# Patient Record
Sex: Female | Born: 1995 | Race: Black or African American | Hispanic: No | Marital: Single | Smoking: Never smoker
Health system: Southern US, Community
[De-identification: ages and names within clinical notes are randomized; demographics above are authoritative.]

## PROBLEM LIST (undated history)

## (undated) ENCOUNTER — Emergency Department (HOSPITAL_COMMUNITY): Discharge status: Absent without leave | Payer: Self-pay

---
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
## 2014-01-26 ENCOUNTER — Encounter (HOSPITAL_COMMUNITY): Payer: Self-pay | Admitting: Emergency Medicine

## 2014-01-26 ENCOUNTER — Emergency Department (HOSPITAL_COMMUNITY)
Admission: EM | Admit: 2014-01-26 | Discharge: 2014-01-26 | Disposition: A | Discharge status: Home / Self Care | Payer: BC Managed Care – PPO | Source: Home / Self Care

## 2014-01-26 DIAGNOSIS — R0982 Postnasal drip: Secondary | ICD-10-CM

## 2014-01-26 LAB — POCT RAPID STREP A: Streptococcus, Group A Screen (Direct): NEGATIVE

## 2014-01-26 NOTE — ED Notes (Signed)
Uri symptoms.  Onset of symptoms 3/2 and c/o hot and cold flashes, Friday (3/6) started the day with laryngitis and reports this am started the same.  Minimal cough and sniffles.  Patient's voice sounds strong

## 2014-01-26 NOTE — Discharge Instructions (Signed)
Upper Respiratory Infection, Adult Post nasal drip Allegra 180 mg a day Nasal saline frequently Plenty of fluids An upper respiratory infection (URI) is also sometimes known as the common cold. The upper respiratory tract includes the nose, sinuses, throat, trachea, and bronchi. Bronchi are the airways leading to the lungs. Most people improve within 1 week, but symptoms can last up to 2 weeks. A residual cough may last even longer.  CAUSES Many different viruses can infect the tissues lining the upper respiratory tract. The tissues become irritated and inflamed and often become very moist. Mucus production is also common. A cold is contagious. You can easily spread the virus to others by oral contact. This includes kissing, sharing a glass, coughing, or sneezing. Touching your mouth or nose and then touching a surface, which is then touched by another person, can also spread the virus. SYMPTOMS  Symptoms typically develop 1 to 3 days after you come in contact with a cold virus. Symptoms vary from person to person. They may include:  Runny nose.  Sneezing.  Nasal congestion.  Sinus irritation.  Sore throat.  Loss of voice (laryngitis).  Cough.  Fatigue.  Muscle aches.  Loss of appetite.  Headache.  Low-grade fever. DIAGNOSIS  You might diagnose your own cold based on familiar symptoms, since most people get a cold 2 to 3 times a year. Your caregiver can confirm this based on your exam. Most importantly, your caregiver can check that your symptoms are not due to another disease such as strep throat, sinusitis, pneumonia, asthma, or epiglottitis. Blood tests, throat tests, and X-rays are not necessary to diagnose a common cold, but they may sometimes be helpful in excluding other more serious diseases. Your caregiver will decide if any further tests are required. RISKS AND COMPLICATIONS  You may be at risk for a more severe case of the common cold if you smoke cigarettes, have  chronic heart disease (such as heart failure) or lung disease (such as asthma), or if you have a weakened immune system. The very young and very old are also at risk for more serious infections. Bacterial sinusitis, middle ear infections, and bacterial pneumonia can complicate the common cold. The common cold can worsen asthma and chronic obstructive pulmonary disease (COPD). Sometimes, these complications can require emergency medical care and may be life-threatening. PREVENTION  The best way to protect against getting a cold is to practice good hygiene. Avoid oral or hand contact with people with cold symptoms. Wash your hands often if contact occurs. There is no clear evidence that vitamin C, vitamin E, echinacea, or exercise reduces the chance of developing a cold. However, it is always recommended to get plenty of rest and practice good nutrition. TREATMENT  Treatment is directed at relieving symptoms. There is no cure. Antibiotics are not effective, because the infection is caused by a virus, not by bacteria. Treatment may include:  Increased fluid intake. Sports drinks offer valuable electrolytes, sugars, and fluids.  Breathing heated mist or steam (vaporizer or shower).  Eating chicken soup or other clear broths, and maintaining good nutrition.  Getting plenty of rest.  Using gargles or lozenges for comfort.  Controlling fevers with ibuprofen or acetaminophen as directed by your caregiver.  Increasing usage of your inhaler if you have asthma. Zinc gel and zinc lozenges, taken in the first 24 hours of the common cold, can shorten the duration and lessen the severity of symptoms. Pain medicines may help with fever, muscle aches, and throat pain. A  variety of non-prescription medicines are available to treat congestion and runny nose. Your caregiver can make recommendations and may suggest nasal or lung inhalers for other symptoms.  HOME CARE INSTRUCTIONS   Only take over-the-counter or  prescription medicines for pain, discomfort, or fever as directed by your caregiver.  Use a warm mist humidifier or inhale steam from a shower to increase air moisture. This may keep secretions moist and make it easier to breathe.  Drink enough water and fluids to keep your urine clear or pale yellow.  Rest as needed.  Return to work when your temperature has returned to normal or as your caregiver advises. You may need to stay home longer to avoid infecting others. You can also use a face mask and careful hand washing to prevent spread of the virus. SEEK MEDICAL CARE IF:   After the first few days, you feel you are getting worse rather than better.  You need your caregiver's advice about medicines to control symptoms.  You develop chills, worsening shortness of breath, or brown or red sputum. These may be signs of pneumonia.  You develop yellow or brown nasal discharge or pain in the face, especially when you bend forward. These may be signs of sinusitis.  You develop a fever, swollen neck glands, pain with swallowing, or white areas in the back of your throat. These may be signs of strep throat. SEEK IMMEDIATE MEDICAL CARE IF:   You have a fever.  You develop severe or persistent headache, ear pain, sinus pain, or chest pain.  You develop wheezing, a prolonged cough, cough up blood, or have a change in your usual mucus (if you have chronic lung disease).  You develop sore muscles or a stiff neck. Document Released: 05/04/2001 Document Revised: 01/31/2012 Document Reviewed: 03/12/2011 [facility] Patient Information 2014 [place], [place].

## 2014-01-26 NOTE — ED Provider Notes (Signed)
CSN: [id number]     Arrival date & time 01/26/14  [id number] History   First MD Initiated Contact with Patient 01/26/14 1036     Chief Complaint  Patient presents with  . URI   (Consider location/radiation/quality/duration/timing/severity/associated sxs/prior Treatment) HPI Comments: C/O PND and change in voice for 3 d.   History reviewed. No pertinent past medical history. History reviewed. No pertinent past surgical history. No family history on file. History  Substance Use Topics  . Smoking status: Never Smoker   . Smokeless tobacco: Not on file  . Alcohol Use: No   OB History   Grav Para Term Preterm Abortions TAB SAB Ect Mult Living                 Review of Systems  Constitutional: Negative for fever, chills, activity change, appetite change and fatigue.  HENT: Positive for postnasal drip and rhinorrhea. Negative for congestion, facial swelling and sore throat.   Eyes: Negative.   Respiratory: Negative.   Cardiovascular: Negative.   Gastrointestinal: Negative.   Musculoskeletal: Negative for neck pain and neck stiffness.  Skin: Negative for pallor and rash.  Neurological: Negative.     Allergies  Penicillins  Home Medications   Current Outpatient Rx  Name  Route  Sig  Dispense  Refill  . ibuprofen (ADVIL,MOTRIN) 200 MG tablet   Oral   Take 200 mg by mouth every 6 (six) hours as needed.          BP 117/74  Pulse 97  Temp(Src) 97.5 F (36.4 C) (Oral)  Resp 19  SpO2 98%  LMP 01/26/2014 Physical Exam  Constitutional: She is oriented to person, place, and time. She appears well-developed and well-nourished. No distress.  HENT:  TM's nl OP with minor erythema and curtains of PND  Neck: Normal range of motion. Neck supple.  Cardiovascular: Normal rate and regular rhythm.   Pulmonary/Chest: Effort normal and breath sounds normal. No respiratory distress. She has no wheezes.  Musculoskeletal: Normal range of motion. She exhibits no edema.  Lymphadenopathy:   She has no cervical adenopathy.  Neurological: She is alert and oriented to person, place, and time.  Skin: Skin is warm and dry. No rash noted.  Psychiatric: She has a normal mood and affect.    ED Course  Procedures (including critical care time) Labs Review Labs Reviewed  POCT RAPID STREP A ([facility]ARE ONLY)   Imaging Review No results found. Results for orders placed during the hospital encounter of 01/26/14  POCT RAPID STREP A ([facility] ONLY)      Result Value Ref Range   Streptococcus, Group A Screen (Direct) NEGATIVE  NEGATIVE     MDM   1. PND (post-nasal drip)     PND instructions Allegra 180 mg Nasal saline Lots of fluids    Hayden Rasmussenavid Kadesha Virrueta, NP 01/26/14 1111

## 2014-01-26 NOTE — ED Provider Notes (Signed)
Medical screening examination/treatment/procedure(s) were performed by resident physician or non-physician practitioner and as supervising physician I was immediately available for consultation/collaboration.   Turki Tapanes DOUGLAS MD.   Fernandez Kenley D Gearlene Godsil, MD 01/26/14 1454 

## 2014-01-28 LAB — CULTURE, GROUP A STREP

## 2014-04-03 ENCOUNTER — Emergency Department (HOSPITAL_COMMUNITY)
Admission: EM | Admit: 2014-04-03 | Discharge: 2014-04-03 | Disposition: A | Discharge status: Home / Self Care | Payer: BC Managed Care – PPO | Source: Home / Self Care | Attending: Emergency Medicine | Admitting: Emergency Medicine

## 2014-04-03 ENCOUNTER — Encounter (HOSPITAL_COMMUNITY): Payer: Self-pay | Admitting: Emergency Medicine

## 2014-04-03 DIAGNOSIS — W2203XA Walked into furniture, initial encounter: Secondary | ICD-10-CM

## 2014-04-03 DIAGNOSIS — S0083XA Contusion of other part of head, initial encounter: Secondary | ICD-10-CM

## 2014-04-03 DIAGNOSIS — S1093XA Contusion of unspecified part of neck, initial encounter: Secondary | ICD-10-CM

## 2014-04-03 DIAGNOSIS — S0003XA Contusion of scalp, initial encounter: Secondary | ICD-10-CM

## 2014-04-03 NOTE — ED Notes (Signed)
C/o head injury States her bed collapse on her head last week States she did not loss conscious Did have a chronic headache last night

## 2014-04-03 NOTE — ED Provider Notes (Signed)
  Chief Complaint   Chief Complaint  Patient presents with  . 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 Apparently the bed swings up to get out of the way, and she was trying to let the bed back down again. It got away from her, striking her on the head. There was no loss of consciousness. She has a sore spot on the top of her head since then. There was no bleeding. She denies any symptoms of concussion such as persistent headache, light or sound sensitivity, or fuzzy thinking or difficulty concentrating. She's had no visual symptoms, no bleeding from the ears or the nose. She denies any numbness, tingling, weakness, or difficulty with speech or ambulation.  Review of Systems   Other than as noted above, the patient denies any of the following symptoms: Eye:  No eye pain, diplopia or blurred vision ENT:  No bleeding from nose or ears.  No loose or broken teeth. Neck:  No pain or limited ROM. GI:  No nausea or vomiting. Neuro:  No loss of consciousness, seizure activity, numbness, tingling, or weakness.  PMFSH   Past medical history, family history, social history, meds, and allergies were reviewed.   Physical Examination   Vital signs:  BP 105/63  Pulse 85  Temp(Src) 98.6 F (37 C) (Oral)  Resp 18  SpO2 100%  LMP 03/21/2014 General:  Alert and oriented times 3.  In no distress. Eye:  PERRL, full EOMs.  Lids and conjunctivas normal. Fundi benign. HEENT:  She has minimal soreness on the top of the head. There is no hematoma, swelling, or bruise, no palpable abnormality.  TMs and canals normal, nasal mucosa normal.  No oral lacerations.  Teeth were intact without obvious oral trauma. Neck:  Non tender.  Full ROM without pain. Neurological:  Alert and oriented.  Cranial nerves intact.  No pronator drift. Finger to nose test was normal.  No muscle weakness. DTRs were  symmetrical.  Sensation was intact to light touch. Gait was normal.  Romberg's sign negative.  Able to perform tandem gait well.  Assessment   The encounter diagnosis was Scalp contusion.  No evidence of concussion.  Plan   1.  Meds:  The following meds were prescribed:   Discharge Medication List as of 04/03/2014 12:18 PM      2.  Patient Education/Counseling:  The patient was given appropriate handouts, self care instructions, and instructed in symptomatic relief.    3.  Follow up:  The patient was told to follow up here if no better in 3 to 4 days,or sooner if becoming worse in any way, and given some red flag symptoms such as change in level of consciousness, worsening headache, visual changes, persistent vomiting, or neurological symptoms which would prompt immediate return.       Reuben Likesavid C Hymie Gorr, MD 04/03/14 [phone]

## 2014-04-03 NOTE — Discharge Instructions (Signed)
Contusion  A contusion is a deep bruise. Contusions happen when an injury causes bleeding under the skin. Signs of bruising include pain, puffiness (swelling), and discolored skin. The contusion may turn blue, purple, or yellow.  HOME CARE   · Put ice on the injured area.  · Put ice in a plastic bag.  · Place a towel between your skin and the bag.  · Leave the ice on for 15-20 minutes, 03-04 times a day.  · Only take medicine as told by your doctor.  · Rest the injured area.  · If possible, raise (elevate) the injured area to lessen puffiness.  GET HELP RIGHT AWAY IF:   · You have more bruising or puffiness.  · You have pain that is getting worse.  · Your puffiness or pain is not helped by medicine.  MAKE SURE YOU:   · Understand these instructions.  · Will watch your condition.  · Will get help right away if you are not doing well or get worse.  Document Released: 04/26/2008 Document Revised: 01/31/2012 Document Reviewed: 09/13/2011  [employer]® Patient Information ©2014 [employer].

## 2015-12-03 ENCOUNTER — Telehealth: Payer: Self-pay | Admitting: Family

## 2015-12-03 ENCOUNTER — Encounter: Payer: Self-pay | Admitting: Family

## 2015-12-03 NOTE — Telephone Encounter (Signed)
Excuse letter dated 12/03/15 was written in error. Wrong patient.

## 2016-11-09 DIAGNOSIS — A5901 Trichomonal vulvovaginitis: Secondary | ICD-10-CM | POA: Diagnosis not present

## 2016-11-09 DIAGNOSIS — A599 Trichomoniasis, unspecified: Secondary | ICD-10-CM | POA: Diagnosis not present

## 2016-11-09 DIAGNOSIS — N72 Inflammatory disease of cervix uteri: Secondary | ICD-10-CM | POA: Diagnosis not present

## 2016-11-09 DIAGNOSIS — Z01419 Encounter for gynecological examination (general) (routine) without abnormal findings: Secondary | ICD-10-CM | POA: Diagnosis not present

## 2016-11-10 DIAGNOSIS — Z01419 Encounter for gynecological examination (general) (routine) without abnormal findings: Secondary | ICD-10-CM | POA: Diagnosis not present

## 2016-12-06 DIAGNOSIS — A599 Trichomoniasis, unspecified: Secondary | ICD-10-CM | POA: Diagnosis not present

## 2018-09-22 DIAGNOSIS — R350 Frequency of micturition: Secondary | ICD-10-CM | POA: Diagnosis not present

## 2018-09-22 DIAGNOSIS — B373 Candidiasis of vulva and vagina: Secondary | ICD-10-CM | POA: Diagnosis not present

## 2018-09-22 DIAGNOSIS — Z114 Encounter for screening for human immunodeficiency virus [HIV]: Secondary | ICD-10-CM | POA: Diagnosis not present

## 2018-09-22 DIAGNOSIS — Z113 Encounter for screening for infections with a predominantly sexual mode of transmission: Secondary | ICD-10-CM | POA: Diagnosis not present

## 2018-10-05 DIAGNOSIS — R079 Chest pain, unspecified: Secondary | ICD-10-CM | POA: Diagnosis not present

## 2018-10-13 DIAGNOSIS — N390 Urinary tract infection, site not specified: Secondary | ICD-10-CM | POA: Diagnosis not present

## 2018-11-22 DIAGNOSIS — N3000 Acute cystitis without hematuria: Secondary | ICD-10-CM | POA: Diagnosis not present

## 2019-01-19 DIAGNOSIS — M25571 Pain in right ankle and joints of right foot: Secondary | ICD-10-CM | POA: Diagnosis not present

## 2019-01-19 DIAGNOSIS — M25532 Pain in left wrist: Secondary | ICD-10-CM | POA: Diagnosis not present

## 2019-02-12 DIAGNOSIS — Z3202 Encounter for pregnancy test, result negative: Secondary | ICD-10-CM | POA: Diagnosis not present

## 2019-02-12 DIAGNOSIS — R350 Frequency of micturition: Secondary | ICD-10-CM | POA: Diagnosis not present

## 2019-02-12 DIAGNOSIS — Z113 Encounter for screening for infections with a predominantly sexual mode of transmission: Secondary | ICD-10-CM | POA: Diagnosis not present

## 2019-06-11 DIAGNOSIS — Z03818 Encounter for observation for suspected exposure to other biological agents ruled out: Secondary | ICD-10-CM | POA: Diagnosis not present

## 2023-03-07 ENCOUNTER — Other Ambulatory Visit (HOSPITAL_BASED_OUTPATIENT_CLINIC_OR_DEPARTMENT_OTHER): Payer: Self-pay

## 2023-08-03 ENCOUNTER — Other Ambulatory Visit: Payer: Self-pay | Admitting: Endocrinology

## 2023-08-03 DIAGNOSIS — R7989 Other specified abnormal findings of blood chemistry: Secondary | ICD-10-CM

## 2023-09-05 ENCOUNTER — Encounter: Payer: Self-pay | Admitting: Endocrinology

## 2023-09-10 ENCOUNTER — Ambulatory Visit
Admission: RE | Admit: 2023-09-10 | Discharge: 2023-09-10 | Disposition: A | Discharge status: Home / Self Care | Payer: BC Managed Care – PPO | Source: Ambulatory Visit | Attending: Endocrinology | Admitting: Endocrinology

## 2023-09-10 DIAGNOSIS — R7989 Other specified abnormal findings of blood chemistry: Secondary | ICD-10-CM

## 2023-09-10 MED ORDER — GADOPICLENOL 0.5 MMOL/ML IV SOLN
10.0000 mL | Freq: Once | INTRAVENOUS | Status: AC | PRN
  Administered 2023-09-10: 10 mL via INTRAVENOUS

## 2024-03-05 ENCOUNTER — Other Ambulatory Visit (HOSPITAL_COMMUNITY): Payer: Self-pay

## 2024-03-05 MED ORDER — PHENTERMINE HCL 37.5 MG PO TABS
37.5000 mg | ORAL_TABLET | Freq: Every day | ORAL | 0 refills | Status: DC
  Filled 2024-03-05: qty 30, 30d supply, fill #0

## 2024-03-05 MED ORDER — AZITHROMYCIN 250 MG PO TABS
ORAL_TABLET | ORAL | 0 refills | Status: AC
  Filled 2024-03-05: qty 6, 5d supply, fill #0

## 2024-03-05 MED ORDER — ERGOCALCIFEROL 1.25 MG (50000 UT) PO CAPS
50000.0000 [IU] | ORAL_CAPSULE | ORAL | 5 refills | Status: DC
  Filled 2024-03-05: qty 4, 28d supply, fill #0

## 2024-03-05 MED ORDER — WEGOVY 0.25 MG/0.5ML ~~LOC~~ SOAJ
0.2500 mg | SUBCUTANEOUS | 0 refills | Status: DC
  Filled 2024-03-05: qty 2, 28d supply, fill #0

## 2024-03-22 ENCOUNTER — Other Ambulatory Visit (HOSPITAL_COMMUNITY): Payer: Self-pay

## 2024-04-14 ENCOUNTER — Other Ambulatory Visit (HOSPITAL_COMMUNITY): Payer: Self-pay

## 2024-04-14 MED ORDER — PHENTERMINE HCL 37.5 MG PO TABS
37.5000 mg | ORAL_TABLET | Freq: Every day | ORAL | 0 refills | Status: DC
  Filled 2024-04-14: qty 30, 30d supply, fill #0

## 2024-04-15 ENCOUNTER — Other Ambulatory Visit: Payer: Self-pay

## 2024-04-27 ENCOUNTER — Other Ambulatory Visit (HOSPITAL_COMMUNITY): Payer: Self-pay

## 2024-05-12 ENCOUNTER — Other Ambulatory Visit (HOSPITAL_BASED_OUTPATIENT_CLINIC_OR_DEPARTMENT_OTHER): Payer: Self-pay

## 2024-05-12 ENCOUNTER — Other Ambulatory Visit (HOSPITAL_COMMUNITY): Payer: Self-pay

## 2024-05-12 MED ORDER — PHENTERMINE HCL 37.5 MG PO TABS
37.5000 mg | ORAL_TABLET | Freq: Every day | ORAL | 0 refills | Status: DC
  Filled 2024-05-12: qty 30, 30d supply, fill #0

## 2024-05-14 ENCOUNTER — Other Ambulatory Visit: Payer: Self-pay

## 2024-05-24 ENCOUNTER — Other Ambulatory Visit (HOSPITAL_COMMUNITY): Payer: Self-pay

## 2024-07-07 ENCOUNTER — Other Ambulatory Visit (HOSPITAL_COMMUNITY): Payer: Self-pay

## 2024-07-07 MED ORDER — PHENTERMINE HCL 37.5 MG PO TABS
37.5000 mg | ORAL_TABLET | Freq: Every day | ORAL | 0 refills | Status: DC
  Filled 2024-07-07: qty 30, 30d supply, fill #0

## 2024-07-18 ENCOUNTER — Other Ambulatory Visit (HOSPITAL_COMMUNITY): Payer: Self-pay

## 2024-11-23 ENCOUNTER — Other Ambulatory Visit (HOSPITAL_COMMUNITY): Payer: Self-pay

## 2024-11-23 MED ORDER — AZITHROMYCIN 250 MG PO TABS
ORAL_TABLET | ORAL | 0 refills | Status: AC
  Filled 2024-11-23: qty 6, 5d supply, fill #0

## 2024-11-23 MED ORDER — WEGOVY 0.25 MG/0.5ML ~~LOC~~ SOAJ
0.2500 mg | SUBCUTANEOUS | 0 refills | Status: DC
  Filled 2024-11-23: qty 2, 28d supply, fill #0

## 2024-11-23 MED ORDER — DIETHYLPROPION HCL ER 75 MG PO TB24
75.0000 mg | ORAL_TABLET | Freq: Every day | ORAL | 0 refills | Status: DC
  Filled 2024-11-23 – 2024-12-07 (×2): qty 30, 30d supply, fill #0

## 2024-12-07 ENCOUNTER — Other Ambulatory Visit (HOSPITAL_COMMUNITY): Payer: Self-pay

## 2024-12-10 ENCOUNTER — Other Ambulatory Visit (HOSPITAL_COMMUNITY): Payer: Self-pay

## 2024-12-10 MED ORDER — AZITHROMYCIN 250 MG PO TABS
ORAL_TABLET | ORAL | 0 refills | Status: DC
  Filled 2024-12-10: qty 6, 5d supply, fill #0

## 2024-12-15 ENCOUNTER — Emergency Department (HOSPITAL_COMMUNITY)

## 2024-12-15 ENCOUNTER — Ambulatory Visit (HOSPITAL_COMMUNITY)
Admission: RE | Admit: 2024-12-15 | Discharge: 2024-12-15 | Disposition: A | Discharge status: Home / Self Care | Source: Ambulatory Visit | Attending: Family Medicine | Admitting: Family Medicine

## 2024-12-15 ENCOUNTER — Other Ambulatory Visit: Payer: Self-pay

## 2024-12-15 ENCOUNTER — Inpatient Hospital Stay (HOSPITAL_COMMUNITY)
Admission: EM | Admit: 2024-12-15 | Discharge: 2024-12-19 | DRG: 872 | Disposition: A | Discharge status: Home / Self Care | Attending: Internal Medicine | Admitting: Internal Medicine

## 2024-12-15 ENCOUNTER — Encounter (HOSPITAL_COMMUNITY): Payer: Self-pay

## 2024-12-15 VITALS — BP 127/83 | HR 162 | Temp 100.2°F | Resp 20

## 2024-12-15 DIAGNOSIS — E872 Acidosis, unspecified: Secondary | ICD-10-CM | POA: Diagnosis present

## 2024-12-15 DIAGNOSIS — Z7985 Long-term (current) use of injectable non-insulin antidiabetic drugs: Secondary | ICD-10-CM

## 2024-12-15 DIAGNOSIS — R000 Tachycardia, unspecified: Secondary | ICD-10-CM

## 2024-12-15 DIAGNOSIS — Z6841 Body Mass Index (BMI) 40.0 and over, adult: Secondary | ICD-10-CM | POA: Diagnosis not present

## 2024-12-15 DIAGNOSIS — A419 Sepsis, unspecified organism: Secondary | ICD-10-CM | POA: Diagnosis present

## 2024-12-15 DIAGNOSIS — L0390 Cellulitis, unspecified: Secondary | ICD-10-CM | POA: Diagnosis present

## 2024-12-15 DIAGNOSIS — Z794 Long term (current) use of insulin: Secondary | ICD-10-CM

## 2024-12-15 DIAGNOSIS — I10 Essential (primary) hypertension: Secondary | ICD-10-CM | POA: Diagnosis present

## 2024-12-15 DIAGNOSIS — E871 Hypo-osmolality and hyponatremia: Secondary | ICD-10-CM | POA: Diagnosis present

## 2024-12-15 DIAGNOSIS — N762 Acute vulvitis: Principal | ICD-10-CM | POA: Diagnosis present

## 2024-12-15 DIAGNOSIS — E876 Hypokalemia: Secondary | ICD-10-CM | POA: Diagnosis present

## 2024-12-15 DIAGNOSIS — N764 Abscess of vulva: Secondary | ICD-10-CM

## 2024-12-15 DIAGNOSIS — L03315 Cellulitis of perineum: Secondary | ICD-10-CM | POA: Diagnosis present

## 2024-12-15 DIAGNOSIS — E1165 Type 2 diabetes mellitus with hyperglycemia: Secondary | ICD-10-CM | POA: Diagnosis present

## 2024-12-15 DIAGNOSIS — R591 Generalized enlarged lymph nodes: Secondary | ICD-10-CM

## 2024-12-15 DIAGNOSIS — R160 Hepatomegaly, not elsewhere classified: Secondary | ICD-10-CM

## 2024-12-15 DIAGNOSIS — R6520 Severe sepsis without septic shock: Secondary | ICD-10-CM | POA: Diagnosis present

## 2024-12-15 DIAGNOSIS — Z79899 Other long term (current) drug therapy: Secondary | ICD-10-CM

## 2024-12-15 DIAGNOSIS — R739 Hyperglycemia, unspecified: Secondary | ICD-10-CM | POA: Diagnosis present

## 2024-12-15 LAB — CBC WITH DIFFERENTIAL/PLATELET
Abs Immature Granulocytes: 0.08 10*3/uL — ABNORMAL HIGH (ref 0.00–0.07)
Abs Immature Granulocytes: 0.09 10*3/uL — ABNORMAL HIGH (ref 0.00–0.07)
Basophils Absolute: 0 10*3/uL (ref 0.0–0.1)
Basophils Absolute: 0 10*3/uL (ref 0.0–0.1)
Basophils Relative: 0 %
Basophils Relative: 0 %
Eosinophils Absolute: 0 10*3/uL (ref 0.0–0.5)
Eosinophils Absolute: 0 10*3/uL (ref 0.0–0.5)
Eosinophils Relative: 0 %
Eosinophils Relative: 0 %
HCT: 42.5 % (ref 36.0–46.0)
HCT: 36.3 % (ref 36.0–46.0)
Hemoglobin: 14.9 g/dL (ref 12.0–15.0)
Hemoglobin: 12.6 g/dL (ref 12.0–15.0)
Immature Granulocytes: 1 %
Immature Granulocytes: 1 %
Lymphocytes Relative: 7 %
Lymphocytes Relative: 14 %
Lymphs Abs: 1 10*3/uL (ref 0.7–4.0)
Lymphs Abs: 1.9 10*3/uL (ref 0.7–4.0)
MCH: 30.8 pg (ref 26.0–34.0)
MCH: 30.8 pg (ref 26.0–34.0)
MCHC: 35.1 g/dL (ref 30.0–36.0)
MCHC: 34.7 g/dL (ref 30.0–36.0)
MCV: 87.8 fL (ref 80.0–100.0)
MCV: 88.8 fL (ref 80.0–100.0)
Monocytes Absolute: 0.8 10*3/uL (ref 0.1–1.0)
Monocytes Absolute: 0.9 10*3/uL (ref 0.1–1.0)
Monocytes Relative: 6 %
Monocytes Relative: 6 %
Neutro Abs: 11.8 10*3/uL — ABNORMAL HIGH (ref 1.7–7.7)
Neutro Abs: 10.5 10*3/uL — ABNORMAL HIGH (ref 1.7–7.7)
Neutrophils Relative %: 86 %
Neutrophils Relative %: 79 %
Platelets: 250 10*3/uL (ref 150–400)
Platelets: 206 10*3/uL (ref 150–400)
RBC: 4.84 MIL/uL (ref 3.87–5.11)
RBC: 4.09 MIL/uL (ref 3.87–5.11)
RDW: 12.6 % (ref 11.5–15.5)
RDW: 12.9 % (ref 11.5–15.5)
WBC: 13.6 10*3/uL — ABNORMAL HIGH (ref 4.0–10.5)
WBC: 13.4 10*3/uL — ABNORMAL HIGH (ref 4.0–10.5)
nRBC: 0 % (ref 0.0–0.2)
nRBC: 0 % (ref 0.0–0.2)

## 2024-12-15 LAB — COMPREHENSIVE METABOLIC PANEL WITH GFR
ALT: 28 U/L (ref 0–44)
AST: 20 U/L (ref 15–41)
Albumin: 4 g/dL (ref 3.5–5.0)
Alkaline Phosphatase: 78 U/L (ref 38–126)
Anion gap: 14 (ref 5–15)
BUN: 9 mg/dL (ref 6–20)
CO2: 18 mmol/L — ABNORMAL LOW (ref 22–32)
Calcium: 9.7 mg/dL (ref 8.9–10.3)
Chloride: 97 mmol/L — ABNORMAL LOW (ref 98–111)
Creatinine, Ser: 0.87 mg/dL (ref 0.44–1.00)
GFR, Estimated: >60 mL/min
Glucose, Bld: 424 mg/dL — ABNORMAL HIGH (ref 70–99)
Potassium: 4 mmol/L (ref 3.5–5.1)
Sodium: 129 mmol/L — ABNORMAL LOW (ref 135–145)
Total Bilirubin: 0.9 mg/dL (ref 0.0–1.2)
Total Protein: 8 g/dL (ref 6.5–8.1)

## 2024-12-15 LAB — URINALYSIS, W/ REFLEX TO CULTURE (INFECTION SUSPECTED)
Bacteria, UA: NONE SEEN
Bilirubin Urine: NEGATIVE
Glucose, UA: >=500 mg/dL — AB
Ketones, ur: 5 mg/dL — AB
Leukocytes,Ua: NEGATIVE
Nitrite: NEGATIVE
Protein, ur: NEGATIVE mg/dL
Specific Gravity, Urine: 1.04 — ABNORMAL HIGH (ref 1.005–1.030)
pH: 6 (ref 5.0–8.0)

## 2024-12-15 LAB — PHOSPHORUS: Phosphorus: 2.7 mg/dL (ref 2.5–4.6)

## 2024-12-15 LAB — GLUCOSE, CAPILLARY: Glucose-Capillary: 236 mg/dL — ABNORMAL HIGH (ref 70–99)

## 2024-12-15 LAB — I-STAT CG4 LACTIC ACID, ED
Lactic Acid, Venous: 1.7 mmol/L (ref 0.5–1.9)
Lactic Acid, Venous: 1.1 mmol/L (ref 0.5–1.9)

## 2024-12-15 LAB — CBG MONITORING, ED: Glucose-Capillary: 307 mg/dL — ABNORMAL HIGH (ref 70–99)

## 2024-12-15 LAB — MAGNESIUM: Magnesium: 1.6 mg/dL — ABNORMAL LOW (ref 1.7–2.4)

## 2024-12-15 LAB — HEMOGLOBIN A1C
Hgb A1c MFr Bld: 12.5 % — ABNORMAL HIGH (ref 4.8–5.6)
Mean Plasma Glucose: 312.05 mg/dL

## 2024-12-15 LAB — HCG, SERUM, QUALITATIVE: Preg, Serum: NEGATIVE

## 2024-12-15 LAB — PROTIME-INR
INR: 1.2 (ref 0.8–1.2)
Prothrombin Time: 15.5 s — ABNORMAL HIGH (ref 11.4–15.2)

## 2024-12-15 MED ORDER — LACTATED RINGERS IV BOLUS (SEPSIS): 1000.0000 mL | Freq: Once | INTRAVENOUS | Status: DC

## 2024-12-15 MED ORDER — SENNOSIDES-DOCUSATE SODIUM 8.6-50 MG PO TABS: 1.0000 | ORAL_TABLET | Freq: Every evening | ORAL | Status: DC | PRN

## 2024-12-15 MED ORDER — ACETAMINOPHEN 325 MG PO TABS
650.0000 mg | ORAL_TABLET | Freq: Four times a day (QID) | ORAL | Status: DC | PRN
  Administered 2024-12-15 (×2): 650 mg via ORAL
  Filled 2024-12-15 (×2): qty 2

## 2024-12-15 MED ORDER — IOHEXOL 350 MG/ML SOLN
75.0000 mL | Freq: Once | INTRAVENOUS | Status: AC | PRN
  Administered 2024-12-15: 75 mL via INTRAVENOUS

## 2024-12-15 MED ORDER — HEPARIN SODIUM (PORCINE) 5000 UNIT/ML IJ SOLN
5000.0000 [IU] | Freq: Three times a day (TID) | INTRAMUSCULAR | Status: DC
  Administered 2024-12-15 – 2024-12-16 (×2): 5000 [IU] via SUBCUTANEOUS
  Filled 2024-12-15 (×2): qty 1

## 2024-12-15 MED ORDER — INSULIN ASPART 100 UNIT/ML IJ SOLN
0.0000 [IU] | Freq: Every day | INTRAMUSCULAR | Status: DC
  Administered 2024-12-15: 2 [IU] via SUBCUTANEOUS
  Filled 2024-12-15: qty 1

## 2024-12-15 MED ORDER — SODIUM CHLORIDE 0.9% FLUSH
3.0000 mL | Freq: Two times a day (BID) | INTRAVENOUS | Status: DC
  Administered 2024-12-15 – 2024-12-19 (×8): 3 mL via INTRAVENOUS

## 2024-12-15 MED ORDER — HYDROMORPHONE HCL 1 MG/ML IJ SOLN
0.5000 mg | INTRAMUSCULAR | Status: DC | PRN
  Administered 2024-12-16: 1 mg via INTRAVENOUS
  Filled 2024-12-15: qty 1

## 2024-12-15 MED ORDER — LACTATED RINGERS IV SOLN
150.0000 mL/h | INTRAVENOUS | Status: DC
  Administered 2024-12-15: 150 mL/h via INTRAVENOUS

## 2024-12-15 MED ORDER — MAGNESIUM SULFATE 2 GM/50ML IV SOLN
2.0000 g | Freq: Once | INTRAVENOUS | Status: AC
  Administered 2024-12-15: 2 g via INTRAVENOUS
  Filled 2024-12-15: qty 50

## 2024-12-15 MED ORDER — VANCOMYCIN HCL 2000 MG/400ML IV SOLN
2000.0000 mg | Freq: Once | INTRAVENOUS | Status: AC
  Administered 2024-12-15: 2000 mg via INTRAVENOUS
  Filled 2024-12-15: qty 400

## 2024-12-15 MED ORDER — MORPHINE SULFATE (PF) 4 MG/ML IV SOLN
4.0000 mg | Freq: Once | INTRAVENOUS | Status: AC
  Administered 2024-12-15: 4 mg via INTRAVENOUS
  Filled 2024-12-15: qty 1

## 2024-12-15 MED ORDER — SODIUM CHLORIDE 0.9 % IV BOLUS
2000.0000 mL | Freq: Once | INTRAVENOUS | Status: AC
  Administered 2024-12-15: 2000 mL via INTRAVENOUS

## 2024-12-15 MED ORDER — SODIUM CHLORIDE 0.9 % IV SOLN
2.0000 g | Freq: Once | INTRAVENOUS | Status: AC
  Administered 2024-12-15: 2 g via INTRAVENOUS
  Filled 2024-12-15: qty 12.5

## 2024-12-15 MED ORDER — SODIUM CHLORIDE 0.9 % IV BOLUS
1000.0000 mL | Freq: Once | INTRAVENOUS | Status: AC
  Administered 2024-12-15: 1000 mL via INTRAVENOUS

## 2024-12-15 MED ORDER — IPRATROPIUM BROMIDE 0.02 % IN SOLN: 0.5000 mg | Freq: Four times a day (QID) | RESPIRATORY_TRACT | Status: DC | PRN

## 2024-12-15 MED ORDER — FLEET ENEMA RE ENEM: 1.0000 | ENEMA | Freq: Once | RECTAL | Status: DC | PRN

## 2024-12-15 MED ORDER — VANCOMYCIN HCL IN DEXTROSE 1-5 GM/200ML-% IV SOLN: 1000.0000 mg | Freq: Once | INTRAVENOUS | Status: DC

## 2024-12-15 MED ORDER — ONDANSETRON HCL 4 MG/2ML IJ SOLN
4.0000 mg | Freq: Four times a day (QID) | INTRAMUSCULAR | Status: DC | PRN
  Administered 2024-12-16 – 2024-12-17 (×4): 4 mg via INTRAVENOUS
  Filled 2024-12-15 (×4): qty 2

## 2024-12-15 MED ORDER — ACETAMINOPHEN 650 MG RE SUPP: 650.0000 mg | Freq: Four times a day (QID) | RECTAL | Status: DC | PRN

## 2024-12-15 MED ORDER — ONDANSETRON HCL 4 MG/2ML IJ SOLN
4.0000 mg | Freq: Once | INTRAMUSCULAR | Status: AC
  Administered 2024-12-15: 4 mg via INTRAVENOUS
  Filled 2024-12-15: qty 2

## 2024-12-15 MED ORDER — BISACODYL 5 MG PO TBEC: 5.0000 mg | DELAYED_RELEASE_TABLET | Freq: Every day | ORAL | Status: DC | PRN

## 2024-12-15 MED ORDER — SODIUM CHLORIDE 0.9 % IV SOLN
2.0000 g | Freq: Three times a day (TID) | INTRAVENOUS | Status: DC
  Administered 2024-12-15 – 2024-12-16 (×3): 2 g via INTRAVENOUS
  Filled 2024-12-15 (×3): qty 12.5

## 2024-12-15 MED ORDER — TRAZODONE HCL 50 MG PO TABS
25.0000 mg | ORAL_TABLET | Freq: Every evening | ORAL | Status: DC | PRN
  Filled 2024-12-15: qty 1

## 2024-12-15 MED ORDER — KETOROLAC TROMETHAMINE 15 MG/ML IJ SOLN
15.0000 mg | Freq: Four times a day (QID) | INTRAMUSCULAR | Status: DC | PRN
  Administered 2024-12-15 – 2024-12-16 (×2): 15 mg via INTRAVENOUS
  Filled 2024-12-15 (×2): qty 1

## 2024-12-15 MED ORDER — LACTATED RINGERS IV BOLUS (SEPSIS)
1000.0000 mL | Freq: Once | INTRAVENOUS | Status: AC
  Administered 2024-12-15: 1000 mL via INTRAVENOUS

## 2024-12-15 MED ORDER — VANCOMYCIN HCL 1250 MG/250ML IV SOLN
1250.0000 mg | Freq: Two times a day (BID) | INTRAVENOUS | Status: DC
  Administered 2024-12-16 – 2024-12-18 (×5): 1250 mg via INTRAVENOUS
  Filled 2024-12-15 (×6): qty 250

## 2024-12-15 MED ORDER — INSULIN ASPART 100 UNIT/ML IJ SOLN
0.0000 [IU] | Freq: Three times a day (TID) | INTRAMUSCULAR | Status: DC
  Administered 2024-12-15 – 2024-12-16 (×3): 11 [IU] via SUBCUTANEOUS
  Administered 2024-12-16: 8 [IU] via SUBCUTANEOUS
  Filled 2024-12-15: qty 1
  Filled 2024-12-15: qty 11
  Filled 2024-12-15 (×2): qty 1

## 2024-12-15 MED ORDER — LIDOCAINE-EPINEPHRINE-TETRACAINE (LET) TOPICAL GEL
3.0000 mL | Freq: Once | TOPICAL | Status: AC
  Administered 2024-12-15: 3 mL via TOPICAL
  Filled 2024-12-15: qty 3

## 2024-12-15 MED ORDER — ACETAMINOPHEN 500 MG PO TABS
1000.0000 mg | ORAL_TABLET | Freq: Once | ORAL | Status: AC
  Administered 2024-12-15: 1000 mg via ORAL
  Filled 2024-12-15: qty 2

## 2024-12-15 MED ORDER — SODIUM CHLORIDE 0.9% FLUSH: 3.0000 mL | Freq: Two times a day (BID) | INTRAVENOUS | Status: DC

## 2024-12-15 MED ORDER — LACTATED RINGERS IV SOLN: INTRAVENOUS | Status: DC

## 2024-12-15 MED ORDER — ONDANSETRON HCL 4 MG PO TABS: 4.0000 mg | ORAL_TABLET | Freq: Four times a day (QID) | ORAL | Status: DC | PRN

## 2024-12-15 MED ORDER — HYDRALAZINE HCL 20 MG/ML IJ SOLN: 10.0000 mg | INTRAMUSCULAR | Status: DC | PRN

## 2024-12-15 MED ORDER — OXYCODONE HCL 5 MG PO TABS
5.0000 mg | ORAL_TABLET | ORAL | Status: DC | PRN
  Administered 2024-12-15: 5 mg via ORAL
  Filled 2024-12-15: qty 1

## 2024-12-15 NOTE — ED Notes (Signed)
 Patient is being discharged from the Urgent Care and sent to the Emergency Department via personal opperated vehicle with family. Per Sharlet Linden MD, patient is in need of higher level of care due to tachycardia and emesis with groin abscess . Patient is aware and verbalizes understanding of plan of care.  Vitals:   12/15/24 0831  BP: 127/83  Pulse: (!) 162  Resp: 20  Temp: 100.2 F (37.9 C)  SpO2: 94%

## 2024-12-15 NOTE — ED Notes (Signed)
 Patient transported to CT

## 2024-12-15 NOTE — Hospital Course (Signed)
 PMH of morbid obesity presented to the hospital with complaints of swelling in her groin and fever. Currently found to have mons pubis cellulitis with severe sepsis. Also has new onset diabetes with hyperglycemia.  Significant events: Early January 2026 received 2 rounds of antibiotics with steroids for respiratory infections. 1/24>> admitted to the hospital.  CT abdomen pelvis with contrast soft tissue swelling of left mons pubis/labia without abscess.  A1c 12.5. 1/25>> sinus tachycardia requiring IV Lopressor .  GYN consulted.  Antibiotic change initiated to cefepime  plus Flagyl  and later on to meropenem  due to nausea and vomiting. Consults: GYN   Assessment and plan: Severe sepsis due to cellulitis of mons pubis and labia on left Present on admission. Meeting SIRS criteria with tachycardia, tachypnea, fever and leukocytosis. CT abdomen reassuring for no evidence of abscess. Patient exquisitely tender upon examination in the perineal area. Blood cultures performed.  UA unremarkable so far. Initially on IV vancomycin  and cefepime .  Later on IV vancomycin  plus cefepime  plus Flagyl .  Currently on vancomycin  plus meropenem . Follow-up blood cultures. GYN consulted. Fever control with scheduled Tylenol  and scheduled Toradol . Continue pain control as well. Check CK and CRP in the morning.  New onset diabetes with hyperglycemia. No prior history. Hemoglobin A1c more than 12.  Consistent with diabetes melitis. Suspect acutely worsened in the setting of recent use of steroids. Currently on sliding scale insulin . Will add low-dose insulin  Lantus  due to her ongoing nausea and vomiting. Continue with IV hydration.  Sinus tachycardia. Likely in reaction to fever as well as pain. Due to significantly high rate of 180-190 currently on as needed Lopressor . Will also order oral Lopressor . Monitor on telemetry in progressive care.  Metabolic acidosis. Likely in the setting of sepsis. Non-anion  gap. For now we will monitor. Lactic acid is normal as well Treating with LR.  Hyponatremia. Pseudohyponatremia in the setting of hyperglycemia. Continue to correct sugar.  Morbid obesity. Body mass index is 44.4 kg/m.  Placing the patient at high risk for poor outcome.    Code Status: Full Code

## 2024-12-15 NOTE — Progress Notes (Signed)
eLINK following for sepsis protocol. ?

## 2024-12-15 NOTE — ED Provider Notes (Signed)
 " [facility] AT [facility] Provider Note   CSN: [id number] Arrival date & time: 12/15/24  9145     Patient presents with: Abscess (/) and Tachycardia   Kaitlyn Bird is a 29 y.o. female.  Presents to emergency room with complaint of 3 days of left labia abscess.  Patient reports that this started off as an ingrown hair and has progressed into pain, swelling erythema and she has had low-grade fever at home.  She was seen in urgent care and sent here.  Denies any vaginal discharge or dysuria.  Denies any chest pain, shortness of breath cough abdominal pain nausea vomiting diarrhea.    Abscess      Prior to Admission medications  Medication Sig Start Date End Date Taking? Authorizing Provider  azithromycin  (ZITHROMAX  Z-PAK) 250 MG tablet Take 2 tablets (500 mg total) by mouth daily for 1 day, THEN 1 tablet (250 mg total) daily for 4 days. 12/10/24 12/15/24    Diethylpropion  HCl CR 75 MG TB24 Take 1 tablet (75 mg total) by mouth daily. 11/23/24     ergocalciferol  (VITAMIN D2) 1.25 MG (50000 UT) capsule Take 1 capsule (50,000 Units total) by mouth once a week. 03/05/24     ibuprofen (ADVIL,MOTRIN) 200 MG tablet Take 200 mg by mouth every 6 (six) hours as needed.    [provider]  phentermine  (ADIPEX-P ) 37.5 MG tablet Take 1 tablet (37.5 mg total) by mouth daily. 04/14/24     phentermine  (ADIPEX-P ) 37.5 MG tablet Take 1 tablet (37.5 mg total) by mouth daily. 05/12/24     phentermine  (ADIPEX-P ) 37.5 MG tablet Take 1 tablet (37.5 mg total) by mouth daily. 07/07/24     semaglutide -weight management (WEGOVY ) 0.25 MG/0.5ML SOAJ SQ injection Inject 0.25 mg into the skin once a week. 11/23/24     Semaglutide -Weight Management (WEGOVY ) 0.25 MG/0.5ML SOAJ Inject 0.25 mg into the skin once a week. 03/05/24       Allergies: Penicillins    Review of Systems  Skin:  Positive for color change and rash. Negative for pallor.    Updated Vital Signs BP 122/78    Pulse (!) 130   Temp 98.5 F (36.9 C)   Resp (!) 32   Ht 5' 7 (1.702 m)   Wt 128.4 kg   SpO2 95%   BMI 44.32 kg/m   Physical Exam Vitals and nursing note reviewed.  Constitutional:      General: She is not in acute distress.    Appearance: She is not ill-appearing or toxic-appearing.  HENT:     Head: Normocephalic and atraumatic.  Eyes:     General: No scleral icterus.    Conjunctiva/sclera: Conjunctivae normal.  Cardiovascular:     Rate and Rhythm: Regular rhythm. Tachycardia present.     Pulses: Normal pulses.     Heart sounds: Normal heart sounds.  Pulmonary:     Effort: Pulmonary effort is normal. No respiratory distress.     Breath sounds: Normal breath sounds.  Abdominal:     General: Abdomen is flat. Bowel sounds are normal. There is no distension.     Palpations: Abdomen is soft. There is no mass.     Tenderness: There is no abdominal tenderness. There is no right CVA tenderness or left CVA tenderness.  Genitourinary:    Comments: Exam with chaperone present positive for left labial erythema, swelling and significant pain.  Possible focal area of fluctuance concerning for Bartholin's cyst but exam is limited  due to pain. Skin:    General: Skin is warm and dry.     Findings: No lesion.  Neurological:     General: No focal deficit present.     Mental Status: She is alert and oriented to person, place, and time. Mental status is at baseline.     (all labs ordered are listed, but only abnormal results are displayed) Labs Reviewed  COMPREHENSIVE METABOLIC PANEL WITH GFR - Abnormal; Notable for the following components:      Result Value   Sodium 129 (*)    Chloride 97 (*)    CO2 18 (*)    Glucose, Bld 424 (*)    All other components within normal limits  CBC WITH DIFFERENTIAL/PLATELET - Abnormal; Notable for the following components:   WBC 13.6 (*)    Neutro Abs 11.8 (*)    Abs Immature Granulocytes 0.08 (*)    All other components within normal limits   CULTURE, BLOOD (ROUTINE X 2)  CULTURE, BLOOD (ROUTINE X 2)  HCG, SERUM, QUALITATIVE  URINALYSIS, W/ REFLEX TO CULTURE (INFECTION SUSPECTED)  I-STAT CG4 LACTIC ACID, ED  I-STAT CG4 LACTIC ACID, ED    EKG: None  Radiology: No results found.   .Critical Care  Performed by: Shermon Warren SAILOR, PA-C Authorized by: Shermon Warren SAILOR, PA-C   Critical care provider statement:    Critical care time (minutes):  50   Critical care was necessary to treat or prevent imminent or life-threatening deterioration of the following conditions:  Sepsis   Critical care was time spent personally by me on the following activities:  Development of treatment plan with patient or surrogate, discussions with consultants, evaluation of patient's response to treatment, examination of patient, ordering and review of laboratory studies, ordering and review of radiographic studies, ordering and performing treatments and interventions, pulse oximetry, re-evaluation of patient's condition and review of old charts   Care discussed with: admitting provider      Medications Ordered in the ED  vancomycin  (VANCOREADY) IVPB 2000 mg/400 mL (has no administration in time range)  sodium chloride  0.9 % bolus 2,000 mL (2,000 mLs Intravenous New Bag/Given 12/15/24 0955)  morphine  (PF) 4 MG/ML injection 4 mg (4 mg Intravenous Given 12/15/24 0956)  ondansetron  (ZOFRAN ) injection 4 mg (4 mg Intravenous Given 12/15/24 0955)  lidocaine -EPINEPHrine -tetracaine  (LET) topical gel (3 mLs Topical Given 12/15/24 1002)  ceFEPIme  (MAXIPIME ) 2 g in sodium chloride  0.9 % 100 mL IVPB (0 g Intravenous Stopped 12/15/24 1032)                                    Medical Decision Making Amount and/or Complexity of Data Reviewed Labs: ordered. Radiology: ordered.  Risk OTC drugs. Prescription drug management. Decision regarding hospitalization.   This patient presents to the ED for concern of abscess, this involves an extensive number of  treatment options, and is a complaint that carries with it a high risk of complications and morbidity.  The differential diagnosis includes hydration, electrolyte abnormality, abscess, deep space infection, necrotizing fasciitis, cellulitis, PID   Co morbidities that complicate the patient evaluation  Elevated glucose   Additional history obtained:  Additional history obtained from care visit from earlier today   Lab Tests:  I personally interpreted labs.  The pertinent results include:   Leukocytosis.  Elevated glucose without evidence of DKA.  Likely pseudohyponatremia. Lactic is unremarkable.  Blood cultures are pending  Imaging Studies ordered:  I ordered imaging studies including CT scan of abdomen and pelvis I independently visualized and interpreted imaging which showed soft tissue stranding of the left labia and mons pubis consistent with cellulitis, incompletely visualized left labia but no focal abscess noted.  Likely reactive left external iliac lymphadenopathy.  Uterine fibroid.  2 indeterminate low-grade liver masses.  Not emergent MRI outpatient. I agree with the radiologist interpretation   Cardiac Monitoring: / EKG:  The patient was maintained on a cardiac monitor.  I personally viewed and interpreted the cardiac monitored which showed an underlying rhythm of: sinus tachycardia rates in 160s   Consultations Obtained:  I requested consultation with the hospitalist for admission,  and discussed lab and imaging findings as well as pertinent plan.   Problem List / ED Course / Critical interventions / Medication management  Patient presents to emergency room with complaint of left labial abscess.  On arrival she has low-grade fever she is tachycardic into 160s and has increased respiratory rate. Mentating well.  On initial evaluation patient was called as code sepsis due to vital signs and suspected infection.  She was started on Vanco, cefepime  and given fluids.    On exam she was found to have left labial swelling, erythema and concern for abscess versus cellulitis.  Unfortunately exam is somewhat limited secondary to pain.  Will obtain CT scan of the abdomen and pelvis to further characterize cellulitis versus abscess.  She does have elevated white blood cell count at 13.6. I ordered medication including cefepime  and Vanco as well as 2 L normal saline for sepsis workup.  Let gel topically, morphine . Reevaluation of the patient after these medicines showed that the patient improved I have reviewed the patients home medicines and have made adjustments as needed. Patient septic likely secondary to cellulitis of the left labia.  Will require admission.        Final diagnoses:  Cellulitis of labia  LAD (lymphadenopathy)  Liver mass  Sepsis, due to unspecified organism, unspecified whether acute organ dysfunction present  Medical Center)    ED Discharge Orders     None          Shermon Warren SAILOR, PA-C 12/15/24 1329    Tegeler, Lonni PARAS, MD 12/15/24 1527  "

## 2024-12-15 NOTE — Progress Notes (Signed)
 Pharmacy Antibiotic Note  Kaitlyn Bird is a 29 y.o. female admitted on 12/15/2024 presenting with concern for cellulitis.  Pharmacy has been consulted for vancomycin  dosing.  Vancomycin  2g IV x 1 given in ED, 1g ordered by admitting provider  Plan: D/c 1g vancomycin  order Vancomycin  1250 mg IV q 12h (eAUC 482) Monitor renal function, Cx and clinical progression to narrow Vancomycin  levels as indicated   Height: 5' 7 (170.2 cm) Weight: 128.4 kg (283 lb) IBW/kg (Calculated) : 61.6  Temp (24hrs), Avg:100.1 F (37.8 C), Min:98.5 F (36.9 C), Max:101.7 F (38.7 C)  Recent Labs  Lab 12/15/24 0911 12/15/24 0942  WBC 13.6*  --   CREATININE 0.87  --   LATICACIDVEN  --  1.7    Estimated Creatinine Clearance: 131.8 mL/min (by C-G formula based on SCr of 0.87 mg/dL).    Allergies[1]  Dorn Poot, PharmD, [facility] Clinical Pharmacist ED Pharmacist Phone # [phone] 12/15/2024 1:44 PM     [1]  Allergies Allergen Reactions   Penicillins

## 2024-12-15 NOTE — Assessment & Plan Note (Signed)
 Left labial vaginal wall cellulitis -Will follow the cultures -CT scan reviewed, negative for any abscess - Started on vancomycin /cefepime , will continue for now, will de-escalate in the next 24-48 hours

## 2024-12-15 NOTE — ED Provider Notes (Addendum)
 " [facility]    CSN: [id number] Arrival date & time: 12/15/24  0806      History   Chief Complaint Chief Complaint  Patient presents with   Groin Swelling    HPI Kaitlyn Bird is a 29 y.o. female.   HPI Here for a painful swelling on her perineum.  About 3 days ago she noticed a little bump and she tried to pop it.  Then it grew to the size of a golf ball and is on the left side in her labia.  It got bigger yesterday and this morning.  Yesterday she had cold chills.  She does feel thirsty and she vomited once this morning.  She is allergic to penicillins  Last menstrual cycle was sometime in the last month.  She has taken ibuprofen and Tylenol  this morning. No past medical history on file.  There are no active problems to display for this patient.   No past surgical history on file.  OB History   No obstetric history on file.      Home Medications    Prior to Admission medications  Medication Sig Start Date End Date Taking? Authorizing Provider  azithromycin  (ZITHROMAX  Z-PAK) 250 MG tablet Take 2 tablets (500 mg total) by mouth daily for 1 day, THEN 1 tablet (250 mg total) daily for 4 days. 12/10/24 12/15/24    Diethylpropion  HCl CR 75 MG TB24 Take 1 tablet (75 mg total) by mouth daily. 11/23/24     ergocalciferol  (VITAMIN D2) 1.25 MG (50000 UT) capsule Take 1 capsule (50,000 Units total) by mouth once a week. 03/05/24     ibuprofen (ADVIL,MOTRIN) 200 MG tablet Take 200 mg by mouth every 6 (six) hours as needed.    [provider]  phentermine  (ADIPEX-P ) 37.5 MG tablet Take 1 tablet (37.5 mg total) by mouth daily. 04/14/24     phentermine  (ADIPEX-P ) 37.5 MG tablet Take 1 tablet (37.5 mg total) by mouth daily. 05/12/24     phentermine  (ADIPEX-P ) 37.5 MG tablet Take 1 tablet (37.5 mg total) by mouth daily. 07/07/24     semaglutide -weight management (WEGOVY ) 0.25 MG/0.5ML SOAJ SQ injection Inject 0.25 mg into the skin once a week. 11/23/24      Semaglutide -Weight Management (WEGOVY ) 0.25 MG/0.5ML SOAJ Inject 0.25 mg into the skin once a week. 03/05/24       Family History No family history on file.  Social History Social History[1]   Allergies   Penicillins   Review of Systems Review of Systems   Physical Exam Triage Vital Signs ED Triage Vitals  Encounter Vitals Group     BP 12/15/24 0831 127/83     Girls Systolic BP Percentile --      Girls Diastolic BP Percentile --      Boys Systolic BP Percentile --      Boys Diastolic BP Percentile --      Pulse Rate 12/15/24 0831 (!) 162     Resp 12/15/24 0831 20     Temp 12/15/24 0831 100.2 F (37.9 C)     Temp Source 12/15/24 0831 Oral     SpO2 12/15/24 0831 94 %     Weight --      Height --      Head Circumference --      Peak Flow --      Pain Score 12/15/24 0830 10     Pain Loc --      Pain Education --  Exclude from Growth Chart --    No data found.  Updated Vital Signs BP 127/83 (BP Location: Left Arm)   Pulse (!) 162   Temp 100.2 F (37.9 C) (Oral)   Resp 20   SpO2 94%   Visual Acuity Right Eye Distance:   Left Eye Distance:   Bilateral Distance:    Right Eye Near:   Left Eye Near:    Bilateral Near:     Physical Exam Vitals reviewed.  Constitutional:      General: She is not in acute distress.    Appearance: She is not ill-appearing, toxic-appearing or diaphoretic.  HENT:     Mouth/Throat:     Mouth: Mucous membranes are moist.  Cardiovascular:     Rate and Rhythm: Regular rhythm. Tachycardia present.     Comments: Heart rate at triage is 162.  It is regular. Pulmonary:     Effort: Pulmonary effort is normal.     Breath sounds: Normal breath sounds.  Genitourinary:    Comments: Deferred Skin:    Coloration: Skin is not jaundiced or pale.  Neurological:     Mental Status: She is alert and oriented to person, place, and time.      UC Treatments / Results  Labs (all labs ordered are listed, but only abnormal results are  displayed) Labs Reviewed - No data to display  EKG   Radiology No results found.  Procedures Procedures (including critical care time)  Medications Ordered in UC Medications - No data to display  Initial Impression / Assessment and Plan / UC Course  I have reviewed the triage vital signs and the nursing notes.  Pertinent labs & imaging results that were available during my care of the patient were reviewed by me and considered in my medical decision making (see chart for details).     Though her blood pressure was normal here, her heart rate is very high and I think she has other symptoms of at least dehydration and possible sepsis.  Though she has taken Tylenol  and ibuprofen, this morning her temperature is still 100.1.  With her having vomited this morning and she is feeling thirsty, I think she needs to have urgent lab evaluation and possibly IV fluids.  I therefore deferred exam of the painful area so that she could receive to the emergency room for further evaluation.  She is agreeable and will go by private vehicle with her husband driving. Final Clinical Impressions(s) / UC Diagnoses   Final diagnoses:  Abscess of genital labia  Tachycardia     Discharge Instructions      She will proceed to the ER     ED Prescriptions   None    PDMP not reviewed this encounter.    Vonna Sharlet POUR, MD 12/15/24 [phone]     [1]  Social History Tobacco Use   Smoking status: Never  Substance Use Topics   Alcohol use: No   Drug use: No     Vonna Sharlet POUR, MD 12/15/24 [phone]  "

## 2024-12-15 NOTE — Progress Notes (Addendum)
 [facility] night cross cover note:   Evening sliding scale insulin  added for his patient with new diagnosis of diabetes, with hemoglobin A1c level of 12.5% when checked earlier today.  Here with cellulitis.  RN conveys persistent sinus tachycardia, heart rates in the 120s to 130s, consistent with heart rates throughout dayshift, with vital signs otherwise.  Stable, with most recent temperature 99.0, down from 101.7 around noon today; still blood pressures in the 120s to 130s, respiratory rate 18, and oxygen saturation in the mid 90s on room air.  RN conveys that the patient is without any new complaints at this time, including no report of any associated chest pain, shortness of breath, or palpitations.  In addition to IV antibiotics, she is also receiving continuous lactated Ringer 's at 150 cc/h.  Most recent lactate was found to be 1.1 when checked around 1530 today.  Will reduce rate of IV fluids slightly and continue to trend vital signs, as above.    Update: Patient's most recent temperature noted to be 102.8.  Continues to demonstrate sinus tachycardia with heart rates in the 130s, as above.  Most recent systolic pressures in the 130s.  Respiratory rate 14-20, with oxygen saturation in the mid 90s on room air.  Is continued to receive IV vancomycin  and cefepime  for her presenting cellulitis, as above.  She is not yet eligible for her next dose of prn acetaminophen  for fever.  Following my brief chart review, it appears that her renal function is at baseline, and that there is no report of any overt recent GI bleeding.  I subsequently added order for prn IV Toradol  for fever refractory to acetaminophen .  She was noted to be mildly hyponatremic for this morning's labs.  Will closely monitor and see serum sodium trend, noting existing order for BMP to be checked with tomorrow morning's labs.   He was also noted to have hypomagnesemia per labs drawn this afternoon, with magnesium  level noted to be 1.6 at  that time.  She is currently receiving 2 g of IV magnesium  sulfate.  I have added repeat magnesium  level to be checked with tomorrow morning's labs.    Eva Pore, DO Hospitalist

## 2024-12-15 NOTE — Assessment & Plan Note (Signed)
 In the setting of sepsis, hyperglycemia -Will continue to monitor closely -Will try to obtain SIADH workup but patient already received IV fluids will be inconclusive

## 2024-12-15 NOTE — Assessment & Plan Note (Signed)
 Met sepsis criteria with Tmax of 101.7, heart rate 135, RR 24, source of infection, left labial vaginal wall cellulitis -Lactic acid 1.7, sodium 129, serum glucose 424 -Blood cultures been obtained -Will try to obtain UA and urine culture -Started on broad-spectrum antibiotics of cefepime  and vancomycin  -Follow the cultures accordingly -Per sepsis protocol continue aggressive IV fluid resuscitation, continue current antibiotics

## 2024-12-15 NOTE — Discharge Instructions (Signed)
 She will proceed to the ER

## 2024-12-15 NOTE — Assessment & Plan Note (Signed)
 Hyperglycemia with blood sugar 424 Ruling out diabetes mellitus  -No history of diabetes mellitus -On Wegovy  as an outpatient for weight loss -Will check hemoglobin A1c -Will start the patient on IV fluids, check CBG q. ACHS, SOSi coverage

## 2024-12-15 NOTE — ED Triage Notes (Signed)
 Patient originally came in for a abscess on her labia. She states the left labia is swollen, and red. She is unable to see if there is redness or discharge due to the swelling. On arrival to ED patients pulse is 169, in triage pulse is remaining in the 160's.   She states she also has had a low grade fever, and has been vomiting. The wound has been there for the past 3 days and it has been progressively getting worse.

## 2024-12-15 NOTE — H&P (Signed)
 " History and Physical   Patient: Kaitlyn Bird                            PCP: Patient, No Pcp Per                    DOB: 04/02/1996            DOA: 12/15/2024 FMW:[id number]             DOS: 12/15/2024, 1:49 PM  Patient, No Pcp Per  Patient coming from:   HOME  I have personally reviewed patient's medical records, in electronic medical records, including:  [place] link, and care everywhere.    Chief Complaint:   Chief Complaint  Patient presents with   Abscess        Tachycardia    History of present illness:    Kaitlyn Bird is a 29 year old female with no significant past medical history presenting with progressive painful and swelling in her perineum area.  Patient reports has been going on for the past 3 days she noticed that bump in her labia try to pop it but it grew to a size of a golf ball, started having erythema edema on the left side of her labia.  And became more tender.  Yesterday had some cold chills but did not have any fever.  Reported 1 episode of vomiting yesterday,.  Feeling excessively thirsty denies any dysuria.    ED Evaluation: Blood pressure 127/78, pulse (!) 135, temperature (!) 101.7 F (38.7 C), temperature source Oral, resp. rate (!) 24, height 5' 7 (1.702 m), weight 128.4 kg, SpO2 97%. LABs: WBC 13.6, neutrophil of 11.8, sodium 129, chloride 97, CO2 18, glucose 424, Lactic acid 1.7,  CT abdomen pelvis: Soft tissue stranding in subcutaneous fat of left mons pubis and labia, consistent with cellulitis. Left labia is incompletely visualized on this exam. Mild left external iliac and obturator lymphadenopathy, likely reactive in etiology.   Multiple uterine fibroids, largest measuring 6 cm. IUD in expected position.  Mild hepatic steatosis.  Two hepatic lesions, largest 1 2.2 cm. Nonemergent outpatient abdomen MRI recommended by radiology        Patient Denies having: Fever, Chills, Cough, SOB, Chest Pain, Abd pain, N/V/D, headache,  dizziness, lightheadedness,  Dysuria, Joint pain, rash, open wounds    Review of Systems: As per HPI, otherwise 10 point review of systems were negative.   ----------------------------------------------------------------------------------------------------------------------  Allergies[1]  Home MEDs:  Prior to Admission medications  Medication Sig Start Date End Date Taking? Authorizing Provider  azithromycin  (ZITHROMAX  Z-PAK) 250 MG tablet Take 2 tablets (500 mg total) by mouth daily for 1 day, THEN 1 tablet (250 mg total) daily for 4 days. 12/10/24 12/15/24    Diethylpropion  HCl CR 75 MG TB24 Take 1 tablet (75 mg total) by mouth daily. 11/23/24     ergocalciferol  (VITAMIN D2) 1.25 MG (50000 UT) capsule Take 1 capsule (50,000 Units total) by mouth once a week. 03/05/24     ibuprofen (ADVIL,MOTRIN) 200 MG tablet Take 200 mg by mouth every 6 (six) hours as needed.    [provider]  phentermine  (ADIPEX-P ) 37.5 MG tablet Take 1 tablet (37.5 mg total) by mouth daily. 07/07/24     semaglutide -weight management (WEGOVY ) 0.25 MG/0.5ML SOAJ SQ injection Inject 0.25 mg into the skin once a week. 11/23/24     Semaglutide -Weight Management (WEGOVY ) 0.25 MG/0.5ML SOAJ Inject 0.25 mg into  the skin once a week. 03/05/24       PRN MEDs: acetaminophen  **OR** acetaminophen , hydrALAZINE , HYDROmorphone  (DILAUDID ) injection, ipratropium, ondansetron  **OR** ondansetron  (ZOFRAN ) IV, oxyCODONE , senna-docusate, traZODone   History reviewed. No pertinent past medical history.  History reviewed. No pertinent surgical history.   reports that she has never smoked. She does not have any smokeless tobacco history on file. She reports that she does not drink alcohol and does not use drugs.   History reviewed. No pertinent family history.  Physical Exam:   Vitals:   12/15/24 1000 12/15/24 1100 12/15/24 1230 12/15/24 1234  BP: 122/78 134/83 127/78   Pulse: (!) 130 (!) 125 (!) 135   Resp: (!) 32 19 (!) 24    Temp:    (!) 101.7 F (38.7 C)  TempSrc:    Oral  SpO2: 95% 97% 97%   Weight:      Height:       Constitutional: NAD, calm, comfortable Eyes: PERRL, lids and conjunctivae normal ENMT: Mucous membranes are moist. Posterior pharynx clear of any exudate or lesions.Normal dentition.  Neck: normal, supple, no masses, no thyromegaly Respiratory: clear to auscultation bilaterally, no wheezing, no crackles. Normal respiratory effort. No accessory muscle use.  Cardiovascular: Regular rate and rhythm, no murmurs / rubs / gallops. No extremity edema. 2+ pedal pulses. No carotid bruits.  Abdomen: no tenderness, no masses palpated. No hepatosplenomegaly. Bowel sounds positive.  Musculoskeletal: no clubbing / cyanosis. No joint deformity upper and lower extremities. Good ROM, no contractures. Normal muscle tone.  Neurologic: CN II-XII grossly intact. Sensation intact, DTR normal. Strength 5/5 in all 4.  Psychiatric: Normal judgment and insight. Alert and oriented x 3. Normal mood.  Skin: no rashes, lesions, ulcers. No induration Wounds: Extensive left vaginal wall labial erythema edema tenderness, no open wounds or drainage         Labs on admission:    I have personally reviewed following labs and imaging studies  CBC: Recent Labs  Lab 12/15/24 0911  WBC 13.6*  NEUTROABS 11.8*  HGB 14.9  HCT 42.5  MCV 87.8  PLT 250   Basic Metabolic Panel: Recent Labs  Lab 12/15/24 0911  NA 129*  K 4.0  CL 97*  CO2 18*  GLUCOSE 424*  BUN 9  CREATININE 0.87  CALCIUM 9.7   GFR: Estimated Creatinine Clearance: 131.8 mL/min (by C-G formula based on SCr of 0.87 mg/dL). Liver Function Tests: Recent Labs  Lab 12/15/24 0911  AST 20  ALT 28  ALKPHOS 78  BILITOT 0.9  PROT 8.0  ALBUMIN 4.0    Urine analysis:    Component Value Date/Time   COLORURINE YELLOW 09/21/2011 2327   APPEARANCEUR CLOUDY (A) 09/21/2011 2327   LABSPEC 1.016 09/21/2011 2327   PHURINE 6.5 09/21/2011 2327    GLUCOSEU NEGATIVE 09/21/2011 2327   HGBUR NEGATIVE 09/21/2011 2327   BILIRUBINUR NEGATIVE 09/21/2011 2327   KETONESUR >80 (A) 09/21/2011 2327   PROTEINUR NEGATIVE 09/21/2011 2327   UROBILINOGEN 1.0 09/21/2011 2327   NITRITE NEGATIVE 09/21/2011 2327   LEUKOCYTESUR NEGATIVE 09/21/2011 2327    Last A1C:  No results found for: HGBA1C   Radiologic Exams on Admission:   CT ABDOMEN PELVIS W CONTRAST Result Date: 12/15/2024 CLINICAL DATA:  Acute abdominal pain. Left labial abscess. Suspected sepsis. EXAM: CT ABDOMEN AND PELVIS WITH CONTRAST TECHNIQUE: Multidetector CT imaging of the abdomen and pelvis was performed using the standard protocol following bolus administration of intravenous contrast. RADIATION DOSE REDUCTION: This exam was performed according to  the departmental dose-optimization program which includes automated exposure control, adjustment of the mA and/or kV according to patient size and/or use of iterative reconstruction technique. CONTRAST:  75mL OMNIPAQUE  IOHEXOL  350 MG/ML SOLN COMPARISON:  None Available. FINDINGS: Lower Chest: No acute findings. Hepatobiliary: Mild diffuse hepatic steatosis is seen. 2.2 cm low-attenuation mass is seen in segment 2 of the left lobe, and 1.9 cm low-attenuation mass is seen in segment 5 of the right lobe. These both have nonspecific characteristics. Gallbladder is unremarkable. No evidence of biliary ductal dilatation. Pancreas:  No mass or inflammatory changes. Spleen: Within normal limits in size and appearance. Adrenals/Urinary Tract: No suspicious masses identified. No evidence of ureteral calculi or hydronephrosis. Unremarkable unopacified urinary bladder. Stomach/Bowel: No evidence of obstruction, inflammatory process or abnormal fluid collections. Normal appendix visualized. Vascular/Lymphatic: Mild left external iliac and obturator lymphadenopathy is seen with largest lymph node measuring 14 mm on image 69/3. Shotty sub-cm abdominal  retroperitoneal lymph nodes are noted, but are not pathologically enlarged. No acute vascular findings. Reproductive: Multiple uterine fibroids are seen, largest measuring 6 cm. IUD is seen in expected position. No evidence of adnexal mass or free fluid. Other: Soft tissue stranding is seen in the subcutaneous fat of the left mons pubis and labia, consistent with cellulitis. Left labia is incompletely visualized on this exam. Musculoskeletal:  No suspicious bone lesions identified. IMPRESSION: Soft tissue stranding in subcutaneous fat of left mons pubis and labia, consistent with cellulitis. Left labia is incompletely visualized on this exam. Mild left external iliac and obturator lymphadenopathy, likely reactive in etiology. Recommend continued follow-up by CT in 6 months. Multiple uterine fibroids, largest measuring 6 cm. IUD in expected position. Mild hepatic steatosis. Two indeterminate low-attenuation liver masses, largest measuring 2.2 cm. Nonemergent outpatient abdomen MRI without and with contrast is recommended for further characterization. Electronically Signed   By: Norleen DELENA Kil M.D.   On: 12/15/2024 12:25    EKG:   Independently reviewed.  Orders placed or performed during the hospital encounter of 12/15/24   ED EKG   ED EKG   EKG 12-Lead   ---------------------------------------------------------------------------------------------------------------------------------------    Assessment / Plan:   Principal Problem:   Sepsis (HCC) Active Problems:   Cellulitis   Hyponatremia   Hyperglycemia   Assessment and Plan: * Sepsis (HCC) Met sepsis criteria with Tmax of 101.7, heart rate 135, RR 24, source of infection, left labial vaginal wall cellulitis -Lactic acid 1.7, sodium 129, serum glucose 424 -Blood cultures been obtained -Will try to obtain UA and urine culture -Started on broad-spectrum antibiotics of cefepime  and vancomycin  -Follow the cultures accordingly -Per sepsis  protocol continue aggressive IV fluid resuscitation, continue current antibiotics  Cellulitis Left labial vaginal wall cellulitis -Will follow the cultures -CT scan reviewed, negative for any abscess - Started on vancomycin /cefepime , will continue for now, will de-escalate in the next 24-48 hours   Hyperglycemia Hyperglycemia with blood sugar 424 Ruling out diabetes mellitus  -No history of diabetes mellitus -On Wegovy  as an outpatient for weight loss -Will check hemoglobin A1c -Will start the patient on IV fluids, check CBG q. ACHS, SOSi coverage  Hyponatremia In the setting of sepsis, hyperglycemia -Will continue to monitor closely -Will try to obtain SIADH workup but patient already received IV fluids will be inconclusive    Morbid obesity Body mass index is 44.32 kg/m. On Wegovy  as an outpatient, once stable can resume medication -Close follow with the PCP with aggressive weight loss  Consults called:  None -------------------------------------------------------------------------------------------------------------------------------------------- DVT prophylaxis:  heparin  injection 5,000 Units Start: 12/15/24 1400 SCDs Start: 12/15/24 1329   Code Status:   Code Status: Full Code   Admission status: Patient will be admitted as Inpatient, with a greater than 2 midnight length of stay. Level of care: Telemetry   Family Communication:  none at bedside  (The above findings and plan of care has been discussed with patient in detail, the patient expressed understanding and agreement of above plan)  --------------------------------------------------------------------------------------------------------------------------------------------------  Disposition Plan:  Anticipated 1-2 days Status is: Inpatient Remains inpatient appropriate because: Meeting sepsis criteria, needing aggressive IV fluid resuscitation, IV  antibiotics     ----------------------------------------------------------------------------------------------------------------------------------------------------  Time spent:  40  Min.  Was spent seeing and evaluating the patient, reviewing all medical records, drawn plan of care.  SIGNED: Adriana DELENA Grams, MD, FHM. FAAFP. [place] [facility], Pager  (Please use [email or web address] to page/ or secure chat through epic) If 7PM-7AM, please contact night-coverage [email or web address],  12/15/2024, 1:49 PM     [1]  Allergies Allergen Reactions   Penicillins    "

## 2024-12-15 NOTE — ED Triage Notes (Signed)
 I have a cyst or swelling  on my vagina. It's painful and swelling - Entered by patient.   Onset 3 days ago and the bump was smaller. Tried to pop it. The next day the area had grown to the size of a golf ball and shifted in position. On the left side of the labia.   Patient tried ibuprofen, tylenol , and hot compresses alternating ice with only slight relief.

## 2024-12-16 DIAGNOSIS — A419 Sepsis, unspecified organism: Secondary | ICD-10-CM

## 2024-12-16 LAB — CBC
HCT: 37.6 % (ref 36.0–46.0)
Hemoglobin: 12.8 g/dL (ref 12.0–15.0)
MCH: 31.1 pg (ref 26.0–34.0)
MCHC: 34 g/dL (ref 30.0–36.0)
MCV: 91.3 fL (ref 80.0–100.0)
Platelets: 183 10*3/uL (ref 150–400)
RBC: 4.12 MIL/uL (ref 3.87–5.11)
RDW: 13.2 % (ref 11.5–15.5)
WBC: 10.4 10*3/uL (ref 4.0–10.5)
nRBC: 0 % (ref 0.0–0.2)

## 2024-12-16 LAB — MAGNESIUM: Magnesium: 2 mg/dL (ref 1.7–2.4)

## 2024-12-16 LAB — BASIC METABOLIC PANEL WITH GFR
Anion gap: 10 (ref 5–15)
BUN: 6 mg/dL (ref 6–20)
CO2: 16 mmol/L — ABNORMAL LOW (ref 22–32)
Calcium: 8.3 mg/dL — ABNORMAL LOW (ref 8.9–10.3)
Chloride: 106 mmol/L (ref 98–111)
Creatinine, Ser: 0.74 mg/dL (ref 0.44–1.00)
GFR, Estimated: >60 mL/min
Glucose, Bld: 315 mg/dL — ABNORMAL HIGH (ref 70–99)
Potassium: 4 mmol/L (ref 3.5–5.1)
Sodium: 132 mmol/L — ABNORMAL LOW (ref 135–145)

## 2024-12-16 LAB — APTT: aPTT: 25 s (ref 24–36)

## 2024-12-16 LAB — GLUCOSE, CAPILLARY
Glucose-Capillary: 310 mg/dL — ABNORMAL HIGH (ref 70–99)
Glucose-Capillary: 305 mg/dL — ABNORMAL HIGH (ref 70–99)
Glucose-Capillary: 257 mg/dL — ABNORMAL HIGH (ref 70–99)
Glucose-Capillary: 202 mg/dL — ABNORMAL HIGH (ref 70–99)

## 2024-12-16 MED ORDER — INSULIN GLARGINE-YFGN 100 UNIT/ML ~~LOC~~ SOLN
8.0000 [IU] | Freq: Every day | SUBCUTANEOUS | Status: DC
  Administered 2024-12-16: 8 [IU] via SUBCUTANEOUS
  Filled 2024-12-16 (×4): qty 0.08

## 2024-12-16 MED ORDER — METOPROLOL TARTRATE 25 MG PO TABS
25.0000 mg | ORAL_TABLET | Freq: Two times a day (BID) | ORAL | Status: DC
  Administered 2024-12-17 – 2024-12-19 (×5): 25 mg via ORAL
  Filled 2024-12-16 (×5): qty 1

## 2024-12-16 MED ORDER — INSULIN ASPART 100 UNIT/ML IJ SOLN
0.0000 [IU] | Freq: Every day | INTRAMUSCULAR | Status: DC
  Administered 2024-12-16: 2 [IU] via SUBCUTANEOUS
  Filled 2024-12-16: qty 1

## 2024-12-16 MED ORDER — METOCLOPRAMIDE HCL 5 MG/ML IJ SOLN
10.0000 mg | Freq: Four times a day (QID) | INTRAMUSCULAR | Status: DC | PRN
  Administered 2024-12-16 – 2024-12-17 (×2): 10 mg via INTRAVENOUS
  Filled 2024-12-16 (×2): qty 2

## 2024-12-16 MED ORDER — KETOROLAC TROMETHAMINE 15 MG/ML IJ SOLN
15.0000 mg | Freq: Four times a day (QID) | INTRAMUSCULAR | Status: DC
  Administered 2024-12-16 – 2024-12-19 (×13): 15 mg via INTRAVENOUS
  Filled 2024-12-16 (×13): qty 1

## 2024-12-16 MED ORDER — SODIUM CHLORIDE 0.9 % IV SOLN
1.0000 g | Freq: Three times a day (TID) | INTRAVENOUS | Status: DC
  Administered 2024-12-16 – 2024-12-18 (×5): 1 g via INTRAVENOUS
  Filled 2024-12-16 (×5): qty 20

## 2024-12-16 MED ORDER — INSULIN ASPART 100 UNIT/ML IJ SOLN
4.0000 [IU] | Freq: Three times a day (TID) | INTRAMUSCULAR | Status: DC
  Administered 2024-12-17 – 2024-12-19 (×7): 4 [IU] via SUBCUTANEOUS
  Filled 2024-12-16: qty 4
  Filled 2024-12-16 (×6): qty 1

## 2024-12-16 MED ORDER — ACETAMINOPHEN 650 MG RE SUPP: 650.0000 mg | Freq: Four times a day (QID) | RECTAL | Status: DC | PRN

## 2024-12-16 MED ORDER — METOPROLOL TARTRATE 5 MG/5ML IV SOLN
5.0000 mg | INTRAVENOUS | Status: DC | PRN
  Administered 2024-12-16: 5 mg via INTRAVENOUS
  Filled 2024-12-16: qty 5

## 2024-12-16 MED ORDER — METRONIDAZOLE 500 MG/100ML IV SOLN
500.0000 mg | Freq: Two times a day (BID) | INTRAVENOUS | Status: DC
  Administered 2024-12-16: 500 mg via INTRAVENOUS
  Filled 2024-12-16: qty 100

## 2024-12-16 MED ORDER — METOPROLOL TARTRATE 5 MG/5ML IV SOLN
5.0000 mg | Freq: Once | INTRAVENOUS | Status: AC
  Administered 2024-12-16: 5 mg via INTRAVENOUS
  Filled 2024-12-16: qty 5

## 2024-12-16 MED ORDER — ACETAMINOPHEN 500 MG PO TABS
1000.0000 mg | ORAL_TABLET | Freq: Four times a day (QID) | ORAL | Status: DC
  Administered 2024-12-16 – 2024-12-19 (×9): 1000 mg via ORAL
  Filled 2024-12-16 (×12): qty 2

## 2024-12-16 MED ORDER — ACETAMINOPHEN 500 MG PO TABS
1000.0000 mg | ORAL_TABLET | Freq: Four times a day (QID) | ORAL | Status: DC | PRN
  Administered 2024-12-16: 1000 mg via ORAL
  Filled 2024-12-16 (×2): qty 2

## 2024-12-16 MED ORDER — INSULIN ASPART 100 UNIT/ML IJ SOLN
0.0000 [IU] | Freq: Three times a day (TID) | INTRAMUSCULAR | Status: DC
  Administered 2024-12-17: 5 [IU] via SUBCUTANEOUS
  Administered 2024-12-17: 8 [IU] via SUBCUTANEOUS
  Administered 2024-12-17: 3 [IU] via SUBCUTANEOUS
  Administered 2024-12-18: 5 [IU] via SUBCUTANEOUS
  Administered 2024-12-18: 2 [IU] via SUBCUTANEOUS
  Filled 2024-12-16: qty 2
  Filled 2024-12-16 (×4): qty 1

## 2024-12-16 MED ORDER — LACTATED RINGERS IV BOLUS
1000.0000 mL | Freq: Once | INTRAVENOUS | Status: AC
  Administered 2024-12-16: 1000 mL via INTRAVENOUS

## 2024-12-16 MED ORDER — METRONIDAZOLE 500 MG PO TABS: 500.0000 mg | ORAL_TABLET | Freq: Two times a day (BID) | ORAL | Status: DC

## 2024-12-16 MED ORDER — ENOXAPARIN SODIUM 60 MG/0.6ML IJ SOSY
60.0000 mg | PREFILLED_SYRINGE | INTRAMUSCULAR | Status: DC
  Administered 2024-12-16 – 2024-12-17 (×2): 60 mg via SUBCUTANEOUS
  Filled 2024-12-16 (×2): qty 0.6

## 2024-12-16 NOTE — Progress Notes (Signed)
 [facility] Progress Note Patient: Kaitlyn Bird FMW:[id number] DOB: [birth year]  DOA: 12/15/2024 DOS: the patient was seen and examined on 12/16/2024  Brief Summary: PMH of morbid obesity presented to the hospital with complaints of swelling in her groin and fever. Currently found to have mons pubis cellulitis with severe sepsis. Also has new onset diabetes with hyperglycemia.  Significant events: Early January 2026 received 2 rounds of antibiotics with steroids for respiratory infections. 1/24>> admitted to the hospital.  CT abdomen pelvis with contrast soft tissue swelling of left mons pubis/labia without abscess.  A1c 12.5. 1/25>> sinus tachycardia requiring IV Lopressor .  GYN consulted.  Antibiotic change initiated to cefepime  plus Flagyl  and later on to meropenem  due to nausea and vomiting. Consults: GYN   Assessment and plan: Severe sepsis due to cellulitis of mons pubis and labia on left Present on admission. Meeting SIRS criteria with tachycardia, tachypnea, fever and leukocytosis. CT abdomen reassuring for no evidence of abscess. Patient exquisitely tender upon examination in the perineal area. Blood cultures performed.  UA unremarkable so far. Initially on IV vancomycin  and cefepime .  Later on IV vancomycin  plus cefepime  plus Flagyl .  Currently on vancomycin  plus meropenem . Follow-up blood cultures. GYN consulted. Fever control with scheduled Tylenol  and scheduled Toradol . Continue pain control as well. Check CK and CRP in the morning.  New onset diabetes with hyperglycemia. No prior history. Hemoglobin A1c more than 12.  Consistent with diabetes melitis. Suspect acutely worsened in the setting of recent use of steroids. Currently on sliding scale insulin . Will add low-dose insulin  Lantus  due to her ongoing nausea and vomiting. Continue with IV hydration.  Sinus tachycardia. Likely in reaction to fever as well as pain. Due to significantly high rate of 180-190  currently on as needed Lopressor . Will also order oral Lopressor . Monitor on telemetry in progressive care.  Metabolic acidosis. Likely in the setting of sepsis. Non-anion gap. For now we will monitor. Lactic acid is normal as well Treating with LR.  Hyponatremia. Pseudohyponatremia in the setting of hyperglycemia. Continue to correct sugar.  Morbid obesity. Body mass index is 44.4 kg/m.  Placing the patient at high risk for poor outcome.    Code Status: Full Code    DVT Prophylaxis: SCDs Start: 12/15/24 1329 Lovenox .   Data review I have Reviewed nursing notes, Vitals, and Lab results. Since last encounter, pertinent lab results CBC and BMP   . I have ordered test including CBC and BMP  . I have discussed pt's care plan and test results with discussed with GYN Dr. Henry from surgery.  .   Physical exam. Vitals:   12/16/24 0754 12/16/24 1141 12/16/24 1245 12/16/24 1636  BP:    (!) 140/81  Pulse:    (!) 130  Resp:    (!) 21  Temp: 100 F (37.8 C) 100.2 F (37.9 C) 98.9 F (37.2 C) (!) 102.1 F (38.9 C)  TempSrc: Oral Oral Oral Oral  SpO2:    96%  Weight:      Height:      RN was present as a chaperone during examination. Patient provided consent for examination. General: in moderate distress, No Rash Cardiovascular: S1 and S2 Present, No Murmur Respiratory: Good respiratory effort, Bilateral Air entry present. No Crackles, No wheezes Abdomen: Bowel Sound present, No tenderness Extremities: Bilateral trace edema Neuro: Alert and oriented x3, no new focal deficit   Subjective: No nausea no vomiting.  Ongoing complaint of stinging pain on her left perennial area.  Severe tenderness  with simple per patient report.  Family Communication: No one at bedside  Disposition Plan: Status is: Inpatient Remains inpatient appropriate because: Monitor for improvement in sepsis physiology.   Planned Discharge Destination:Home Diet: Diet Order             Diet Carb  Modified Room service appropriate? Yes  Diet effective now                   MEDICATIONS: Scheduled Meds:  acetaminophen   1,000 mg Oral Q6H   enoxaparin  (LOVENOX ) injection  60 mg Subcutaneous Q24H   insulin  aspart  0-15 Units Subcutaneous TID WC   insulin  aspart  0-5 Units Subcutaneous QHS   ketorolac   15 mg Intravenous Q6H   metoprolol  tartrate  25 mg Oral BID   sodium chloride  flush  3 mL Intravenous Q12H   Continuous Infusions:  meropenem  (MERREM ) IV     vancomycin  1,250 mg (12/16/24 1331)   PRN Meds:.HYDROmorphone  (DILAUDID ) injection, ipratropium, metoCLOPramide  (REGLAN ) injection, metoprolol  tartrate, ondansetron  **OR** ondansetron  (ZOFRAN ) IV, oxyCODONE , senna-docusate, traZODone  The patient is critically ill with multiple organ systems failure and requires high complexity decision making for assessment and support, frequent evaluation and titration of therapies. Critical Care Time devoted to patient care services described in this note is 35 minutes   Author: Yetta Blanch, MD  [facility] 12/16/2024  5:31 PM Between 7PM-7AM, please contact night-coverage, check [email or web address] for on call.

## 2024-12-16 NOTE — Plan of Care (Signed)
  Problem: Education: Goal: Ability to describe self-care measures that may prevent or decrease complications (Diabetes Survival Skills Education) will improve Outcome: Not Progressing Goal: Individualized Educational Video(s) Outcome: Not Progressing   Problem: Coping: Goal: Ability to adjust to condition or change in health will improve Outcome: Not Progressing   Problem: Fluid Volume: Goal: Ability to maintain a balanced intake and output will improve Outcome: Not Progressing   Problem: Health Behavior/Discharge Planning: Goal: Ability to identify and utilize available resources and services will improve Outcome: Not Progressing Goal: Ability to manage health-related needs will improve Outcome: Not Progressing   Problem: Metabolic: Goal: Ability to maintain appropriate glucose levels will improve Outcome: Not Progressing   Problem: Nutritional: Goal: Maintenance of adequate nutrition will improve Outcome: Not Progressing Goal: Progress toward achieving an optimal weight will improve Outcome: Not Progressing   Problem: Skin Integrity: Goal: Risk for impaired skin integrity will decrease Outcome: Not Progressing   Problem: Tissue Perfusion: Goal: Adequacy of tissue perfusion will improve Outcome: Not Progressing   Problem: Fluid Volume: Goal: Hemodynamic stability will improve Outcome: Not Progressing   Problem: Clinical Measurements: Goal: Diagnostic test results will improve Outcome: Not Progressing Goal: Signs and symptoms of infection will decrease Outcome: Not Progressing   Problem: Respiratory: Goal: Ability to maintain adequate ventilation will improve Outcome: Not Progressing   Problem: Education: Goal: Knowledge of General Education information will improve Description: Including pain rating scale, medication(s)/side effects and non-pharmacologic comfort measures Outcome: Not Progressing   Problem: Health Behavior/Discharge Planning: Goal: Ability  to manage health-related needs will improve Outcome: Not Progressing   Problem: Clinical Measurements: Goal: Ability to maintain clinical measurements within normal limits will improve Outcome: Not Progressing Goal: Will remain free from infection Outcome: Not Progressing Goal: Diagnostic test results will improve Outcome: Not Progressing Goal: Respiratory complications will improve Outcome: Not Progressing Goal: Cardiovascular complication will be avoided Outcome: Not Progressing   Problem: Activity: Goal: Risk for activity intolerance will decrease Outcome: Not Progressing   Problem: Nutrition: Goal: Adequate nutrition will be maintained Outcome: Not Progressing   Problem: Coping: Goal: Level of anxiety will decrease Outcome: Not Progressing   Problem: Elimination: Goal: Will not experience complications related to bowel motility Outcome: Not Progressing Goal: Will not experience complications related to urinary retention Outcome: Not Progressing   Problem: Pain Managment: Goal: General experience of comfort will improve and/or be controlled Outcome: Not Progressing   Problem: Safety: Goal: Ability to remain free from injury will improve Outcome: Not Progressing   Problem: Skin Integrity: Goal: Risk for impaired skin integrity will decrease Outcome: Not Progressing

## 2024-12-16 NOTE — Progress Notes (Signed)
 Pt up and ambulating on bsc, had sustained HR in 150's-180's. 12 lead EKG performed showing ST. MD, Tobie notified, metoprolol  and tylenol  ordered. Pt has no c/o dizziness, SOB, chest pain. Pt asymptomatic.

## 2024-12-16 NOTE — Progress Notes (Addendum)
 Subjective: Patient reports swelling and labial pain that started abouit 3 days.  She called the office on Friday to be seen but had planned to be seen after the weekend.  The pain worsened on Saturday and pt went to ED.  She was admitted and to hospital yesterday and Dr. Tobie called today for a consult as pt sees Dr. Armond.  Dr. Tobie said the pt is septic and was asking for consult to see if there was anything procedural that could be done.  Pt reports not having an LMP d/t IUD in place.  Objective: I have reviewed patient's vital signs, medications, labs, and radiology results.  General: alert, cooperative, and no distress Resp: unlabored breathing Cardio: regular rate and rhythm GI: soft, NT Extremities: no calf tenderness Vaginal Bleeding: none Mons pubis is swollen with slight erythema that extends down to a markedly swollen and tender left labia  Blood cx x 2 NGTD  Assessment/Plan: Vulvar/Mons Pubis Cellulitis without any localized area that could be I&D'd.  Area is with generalized edema and CT scan does not identify any involvement of fascial tissue.  If such involvement does occur, general surgery would need to be contacted for possible debridement.  As of now, the antibiotic regimen (cefipime, metronidazole  and vanc) seems to be the best course of action.  If not improving after 72hrs, consider ID consult for better antibiotic regimen although current seems to have broad coverage and repeat imaging to see if underlying fascial tissue is becoming involved.  I recommend sitz baths TID as well and continuing IV fluids unless otherwise contraindicated and daily CBC with diff.  I discussed with Dr. Tobie.  Pt reports new dx of diabetes which complicates treatment as tight glucose coverage is needed and it looks like pt has been started on insulin .  Appreciate consultation and will continue to follow.  LOS: 1 day    Jon CINDERELLA Rummer, MD 12/16/2024, 12:44 PM

## 2024-12-17 DIAGNOSIS — A419 Sepsis, unspecified organism: Secondary | ICD-10-CM | POA: Diagnosis not present

## 2024-12-17 LAB — CBC WITH DIFFERENTIAL/PLATELET
Basophils Absolute: 0 10*3/uL (ref 0.0–0.1)
Basophils Relative: 0 %
Eosinophils Absolute: 0 10*3/uL (ref 0.0–0.5)
Eosinophils Relative: 0 %
HCT: 35 % — ABNORMAL LOW (ref 36.0–46.0)
Hemoglobin: 12.3 g/dL (ref 12.0–15.0)
Lymphocytes Relative: 12 %
Lymphs Abs: 1.6 10*3/uL (ref 0.7–4.0)
MCH: 31.1 pg (ref 26.0–34.0)
MCHC: 35.1 g/dL (ref 30.0–36.0)
MCV: 88.4 fL (ref 80.0–100.0)
Monocytes Absolute: 1.1 10*3/uL — ABNORMAL HIGH (ref 0.1–1.0)
Monocytes Relative: 8 %
Neutro Abs: 10.6 10*3/uL — ABNORMAL HIGH (ref 1.7–7.7)
Neutrophils Relative %: 80 %
Platelets: 200 10*3/uL (ref 150–400)
RBC: 3.96 MIL/uL (ref 3.87–5.11)
RDW: 13.2 % (ref 11.5–15.5)
WBC: 13.2 10*3/uL — ABNORMAL HIGH (ref 4.0–10.5)
nRBC: 0 % (ref 0.0–0.2)

## 2024-12-17 LAB — GLUCOSE, CAPILLARY
Glucose-Capillary: 228 mg/dL — ABNORMAL HIGH (ref 70–99)
Glucose-Capillary: 254 mg/dL — ABNORMAL HIGH (ref 70–99)
Glucose-Capillary: 178 mg/dL — ABNORMAL HIGH (ref 70–99)
Glucose-Capillary: 156 mg/dL — ABNORMAL HIGH (ref 70–99)

## 2024-12-17 LAB — MAGNESIUM: Magnesium: 1.8 mg/dL (ref 1.7–2.4)

## 2024-12-17 LAB — BASIC METABOLIC PANEL WITH GFR
Anion gap: 12 (ref 5–15)
BUN: 6 mg/dL (ref 6–20)
CO2: 19 mmol/L — ABNORMAL LOW (ref 22–32)
Calcium: 8.4 mg/dL — ABNORMAL LOW (ref 8.9–10.3)
Chloride: 105 mmol/L (ref 98–111)
Creatinine, Ser: 0.8 mg/dL (ref 0.44–1.00)
GFR, Estimated: >60 mL/min
Glucose, Bld: 251 mg/dL — ABNORMAL HIGH (ref 70–99)
Potassium: 3.5 mmol/L (ref 3.5–5.1)
Sodium: 136 mmol/L (ref 135–145)

## 2024-12-17 LAB — BETA-HYDROXYBUTYRIC ACID: Beta-Hydroxybutyric Acid: 1.29 mmol/L — ABNORMAL HIGH (ref 0.05–0.27)

## 2024-12-17 LAB — GC/CHLAMYDIA PROBE AMP (~~LOC~~) NOT AT ARMC
Chlamydia: NEGATIVE
Comment: NEGATIVE
Comment: NORMAL
Neisseria Gonorrhea: NEGATIVE

## 2024-12-17 LAB — LACTIC ACID, PLASMA: Lactic Acid, Venous: 1 mmol/L (ref 0.5–1.9)

## 2024-12-17 MED ORDER — INSULIN GLARGINE-YFGN 100 UNIT/ML ~~LOC~~ SOLN: 20.0000 [IU] | Freq: Every day | SUBCUTANEOUS | Status: DC

## 2024-12-17 MED ORDER — INSULIN STARTER KIT- PEN NEEDLES (ENGLISH)
1.0000 | Freq: Once | Status: AC
  Administered 2024-12-17: 1
  Filled 2024-12-17: qty 1

## 2024-12-17 MED ORDER — LACTATED RINGERS IV SOLN: INTRAVENOUS | Status: DC

## 2024-12-17 MED ORDER — LACTATED RINGERS IV BOLUS
1000.0000 mL | Freq: Once | INTRAVENOUS | Status: AC
  Administered 2024-12-17: 1000 mL via INTRAVENOUS

## 2024-12-17 MED ORDER — INSULIN GLARGINE 100 UNIT/ML ~~LOC~~ SOLN
20.0000 [IU] | Freq: Every day | SUBCUTANEOUS | Status: DC
  Administered 2024-12-17 – 2024-12-18 (×2): 20 [IU] via SUBCUTANEOUS
  Filled 2024-12-17 (×3): qty 0.2

## 2024-12-17 MED ORDER — LIVING WELL WITH DIABETES BOOK
Freq: Once | Status: AC
  Filled 2024-12-17: qty 1

## 2024-12-17 NOTE — Progress Notes (Signed)
 [facility] Progress Note Patient: Kaitlyn Bird FMW:[id number] DOB: 1996-11-14  DOA: 12/15/2024 DOS: the patient was seen and examined on 12/17/2024  Brief Summary: PMH of morbid obesity presented to the hospital with complaints of swelling in her groin and fever. Currently found to have mons pubis cellulitis with severe sepsis. Also has new onset diabetes with hyperglycemia.  Significant events: Early January 2026 received 2 rounds of antibiotics with steroids for respiratory infections. 1/24>> admitted to the hospital.  CT abdomen pelvis with contrast soft tissue swelling of left mons pubis/labia without abscess.  A1c 12.5. 1/25>> sinus tachycardia requiring IV Lopressor .  GYN consulted.  Antibiotic change initiated to cefepime  plus Flagyl  and later on to meropenem  due to nausea and vomiting. Consults: GYN   Assessment and plan: Severe sepsis due to cellulitis of mons pubis and labia on left Present on admission. Meeting SIRS criteria with tachycardia, tachypnea, fever and leukocytosis. CT abdomen reassuring for no evidence of abscess. Patient exquisitely tender upon examination in the perineal area. Blood cultures performed.  UA unremarkable so far. Initially on IV vancomycin  and cefepime .  Later on IV vancomycin  plus cefepime  plus Flagyl .  Currently on vancomycin  plus meropenem . Follow-up blood cultures. GYN consulted. Fever control with scheduled Tylenol  and scheduled Toradol . Continue pain control as well. Check CK and CRP in the morning.  New onset diabetes with hyperglycemia. No prior history. Hemoglobin A1c more than 12.  Consistent with diabetes melitis. Suspect acutely worsened in the setting of recent use of steroids. Currently on sliding scale insulin . Continue Lantus .  Will increase the dose to 0.2 units/kg. Continue with IV hydration.  Sinus tachycardia. Likely in reaction to fever as well as pain. Due to significantly high rate of 180-190 currently on as  needed Lopressor . Will also order oral Lopressor . Monitor on telemetry in progressive care.  Metabolic acidosis. Likely in the setting of sepsis. Non-anion gap. For now we will monitor. Lactic acid is normal as well Treating with LR.  Hyponatremia. Pseudohyponatremia in the setting of hyperglycemia. Continue to correct sugar.  Morbid obesity. Body mass index is 44.4 kg/m.  Placing the patient at high risk for poor outcome.    Code Status: Full Code    DVT Prophylaxis: SCDs Start: 12/15/24 1329   Data review I have Reviewed nursing notes, Vitals, and Lab results. Since last encounter, pertinent lab results CBC and BMP   . I have ordered test including CBC and BMP  .   Physical exam. Vitals:   12/17/24 0811 12/17/24 0818 12/17/24 1156 12/17/24 1216  BP: 131/88 131/88    Pulse:  (!) 108    Resp:      Temp: 99.1 F (37.3 C)  100 F (37.8 C) 98.8 F (37.1 C)  TempSrc: Oral  Oral Oral  SpO2:      Weight:      Height:      General: in Mild distress, No Rash Cardiovascular: S1 and S2 Present, No Murmur Respiratory: Good respiratory effort, Bilateral Air entry present. No Crackles, No wheezes Abdomen: Bowel Sound present, No tenderness Extremities: No edema Neuro: Alert and oriented x3, no new focal deficit  Subjective: No nausea no vomiting no fever no chills.  Feeling better.  Family Communication: No one at bedside  Disposition Plan: Status is: Inpatient Remains inpatient appropriate because: Monitor for improvement in sepsis symptoms   Planned Discharge Destination:Home Diet: Diet Order             Diet Carb Modified Room service appropriate? Yes  Diet effective now                   MEDICATIONS: Scheduled Meds:  acetaminophen   1,000 mg Oral Q6H   enoxaparin  (LOVENOX ) injection  60 mg Subcutaneous Q24H   insulin  aspart  0-15 Units Subcutaneous TID WC   insulin  aspart  0-5 Units Subcutaneous QHS   insulin  aspart  4 Units Subcutaneous TID WC    insulin  glargine  20 Units Subcutaneous QHS   ketorolac   15 mg Intravenous Q6H   metoprolol  tartrate  25 mg Oral BID   sodium chloride  flush  3 mL Intravenous Q12H   Continuous Infusions:  lactated ringers  100 mL/hr at 12/17/24 1501   meropenem  (MERREM ) IV 1 g (12/17/24 1337)   vancomycin  1,250 mg (12/17/24 1503)   PRN Meds:.HYDROmorphone  (DILAUDID ) injection, ipratropium, metoCLOPramide  (REGLAN ) injection, metoprolol  tartrate, ondansetron  **OR** ondansetron  (ZOFRAN ) IV, oxyCODONE , senna-docusate, traZODone   Author: Yetta Blanch, MD  [facility] 12/17/2024  6:21 PM Between 7PM-7AM, please contact night-coverage, check [email or web address] for on call.

## 2024-12-17 NOTE — Inpatient Diabetes Management (Signed)
 Inpatient Diabetes Program Recommendations  AACE/ADA: New Consensus Statement on Inpatient Glycemic Control   Target Ranges:  Prepandial:   less than 140 mg/dL      Peak postprandial:   less than 180 mg/dL (1-2 hours)      Critically ill patients:  140 - 180 mg/dL   Lab Results  Component Value Date   GLUCAP 228 (H) 12/17/2024   HGBA1C 12.5 (H) 12/15/2024    Latest Reference Range & Units 12/16/24 07:56 12/16/24 11:45 12/16/24 16:29 12/16/24 21:20 12/17/24 08:15  Glucose-Capillary 70 - 99 mg/dL 689 (H) 694 (H) 742 (H) 202 (H) 228 (H)   Review of Glycemic Control  Diabetes history: None   Outpatient Diabetes medications: None   Current orders for Inpatient glycemic control:  Semglee  20 units at bedtime  Novolog  0-15 units at bedtime + 0-5 units at bedtime  Novolog  4 units TID with meals   Inpatient Diabetes Program Recommendations:   Noted New Diabetes Diagnosis   Ordered:  Living Well with Diabetes Book  Insulin  Starter Kit   Thanks,  Lavanda Search, RN, MSN, [facility]  Inpatient Diabetes Coordinator  Pager [phone] (8a-5p)

## 2024-12-17 NOTE — Progress Notes (Signed)
 Subjective:    Reports feeling markedly better today, pain is minimal, and is able to sit in a reclined position versus supine. Mild tenderness and small bleeding from from left labia when wiping, denies warmth and any other discharge from the infected site. Ambulating, voiding, and caring for self w/o difficulty. C/O bouts of nausea and poor appetite since admission, denies vomiting.   Objective:    VS: BP 131/88   Pulse (!) 108   Temp 98.8 F (37.1 C) (Oral)   Resp 18   Ht 5' 7 (1.702 m)   Wt 128.6 kg   SpO2 95%   BMI 44.40 kg/m  Gen: AAO x3, NAD CV: RRR Resp: clear, unlabored GI: soft, non-tender, non-gravid GU: asymmetry of labia, scant bleeding from left labia,  Ext:   Assessment/Plan:   28 y.o.  LOS 2 Sepsis  secondary to cellulitis of mons pubis Continue sepsis management per Medicine and I/D See consult note from OB - Dr. Jon Rummer:      Kaitlyn KATHEE Peal DNP, CNM 12/17/2024 3:42 PM

## 2024-12-18 ENCOUNTER — Telehealth (HOSPITAL_COMMUNITY): Payer: Self-pay

## 2024-12-18 ENCOUNTER — Other Ambulatory Visit (HOSPITAL_COMMUNITY): Payer: Self-pay

## 2024-12-18 DIAGNOSIS — A419 Sepsis, unspecified organism: Secondary | ICD-10-CM | POA: Diagnosis not present

## 2024-12-18 LAB — CK: Total CK: 28 U/L — ABNORMAL LOW (ref 38–234)

## 2024-12-18 LAB — CBC WITH DIFFERENTIAL/PLATELET
Abs Immature Granulocytes: 0.08 10*3/uL — ABNORMAL HIGH (ref 0.00–0.07)
Basophils Absolute: 0 10*3/uL (ref 0.0–0.1)
Basophils Relative: 0 %
Eosinophils Absolute: 0.1 10*3/uL (ref 0.0–0.5)
Eosinophils Relative: 1 %
HCT: 32.8 % — ABNORMAL LOW (ref 36.0–46.0)
Hemoglobin: 11.3 g/dL — ABNORMAL LOW (ref 12.0–15.0)
Immature Granulocytes: 1 %
Lymphocytes Relative: 14 %
Lymphs Abs: 1.4 10*3/uL (ref 0.7–4.0)
MCH: 31 pg (ref 26.0–34.0)
MCHC: 34.5 g/dL (ref 30.0–36.0)
MCV: 90.1 fL (ref 80.0–100.0)
Monocytes Absolute: 0.9 10*3/uL (ref 0.1–1.0)
Monocytes Relative: 9 %
Neutro Abs: 8 10*3/uL — ABNORMAL HIGH (ref 1.7–7.7)
Neutrophils Relative %: 75 %
Platelets: 217 10*3/uL (ref 150–400)
RBC: 3.64 MIL/uL — ABNORMAL LOW (ref 3.87–5.11)
RDW: 13.2 % (ref 11.5–15.5)
Smear Review: NORMAL
WBC: 10.5 10*3/uL (ref 4.0–10.5)
nRBC: 0 % (ref 0.0–0.2)

## 2024-12-18 LAB — BASIC METABOLIC PANEL WITH GFR
Anion gap: 11 (ref 5–15)
BUN: 6 mg/dL (ref 6–20)
CO2: 20 mmol/L — ABNORMAL LOW (ref 22–32)
Calcium: 8.3 mg/dL — ABNORMAL LOW (ref 8.9–10.3)
Chloride: 108 mmol/L (ref 98–111)
Creatinine, Ser: 0.61 mg/dL (ref 0.44–1.00)
GFR, Estimated: >60 mL/min
Glucose, Bld: 154 mg/dL — ABNORMAL HIGH (ref 70–99)
Potassium: 3.4 mmol/L — ABNORMAL LOW (ref 3.5–5.1)
Sodium: 139 mmol/L (ref 135–145)

## 2024-12-18 LAB — GLUCOSE, CAPILLARY
Glucose-Capillary: 167 mg/dL — ABNORMAL HIGH (ref 70–99)
Glucose-Capillary: 204 mg/dL — ABNORMAL HIGH (ref 70–99)
Glucose-Capillary: 133 mg/dL — ABNORMAL HIGH (ref 70–99)
Glucose-Capillary: 120 mg/dL — ABNORMAL HIGH (ref 70–99)

## 2024-12-18 LAB — C-REACTIVE PROTEIN: CRP: 22.5 mg/dL — ABNORMAL HIGH

## 2024-12-18 LAB — MAGNESIUM: Magnesium: 2 mg/dL (ref 1.7–2.4)

## 2024-12-18 MED ORDER — DOXYCYCLINE HYCLATE 100 MG PO TABS
100.0000 mg | ORAL_TABLET | Freq: Two times a day (BID) | ORAL | Status: DC
  Administered 2024-12-18 – 2024-12-19 (×3): 100 mg via ORAL
  Filled 2024-12-18 (×3): qty 1

## 2024-12-18 MED ORDER — ENOXAPARIN SODIUM 80 MG/0.8ML IJ SOSY
65.0000 mg | PREFILLED_SYRINGE | INTRAMUSCULAR | Status: DC
  Administered 2024-12-18 – 2024-12-19 (×2): 65 mg via SUBCUTANEOUS
  Filled 2024-12-18 (×2): qty 0.8

## 2024-12-18 MED ORDER — AMOXICILLIN 500 MG PO CAPS
500.0000 mg | ORAL_CAPSULE | Freq: Once | ORAL | Status: AC
  Administered 2024-12-18: 500 mg via ORAL
  Filled 2024-12-18: qty 1

## 2024-12-18 MED ORDER — EPINEPHRINE 0.3 MG/0.3ML IJ SOAJ
0.3000 mg | Freq: Once | INTRAMUSCULAR | Status: DC | PRN
  Filled 2024-12-18: qty 0.3

## 2024-12-18 MED ORDER — DOXYCYCLINE HYCLATE 100 MG PO TABS: 100.0000 mg | ORAL_TABLET | Freq: Two times a day (BID) | ORAL | Status: DC

## 2024-12-18 MED ORDER — AMOXICILLIN-POT CLAVULANATE 875-125 MG PO TABS
1.0000 | ORAL_TABLET | Freq: Two times a day (BID) | ORAL | Status: DC
  Administered 2024-12-18 – 2024-12-19 (×2): 1 via ORAL
  Filled 2024-12-18 (×2): qty 1

## 2024-12-18 MED ORDER — DIPHENHYDRAMINE HCL 50 MG/ML IJ SOLN: 25.0000 mg | Freq: Once | INTRAMUSCULAR | Status: DC | PRN

## 2024-12-18 MED ORDER — CEFUROXIME AXETIL 250 MG PO TABS
500.0000 mg | ORAL_TABLET | Freq: Two times a day (BID) | ORAL | Status: DC
  Filled 2024-12-18 (×2): qty 2

## 2024-12-18 MED ORDER — POTASSIUM CHLORIDE CRYS ER 20 MEQ PO TBCR
40.0000 meq | EXTENDED_RELEASE_TABLET | ORAL | Status: AC
  Administered 2024-12-18 (×2): 40 meq via ORAL
  Filled 2024-12-18 (×2): qty 2

## 2024-12-18 NOTE — Inpatient Diabetes Management (Addendum)
 Inpatient Diabetes Program Recommendations  AACE/ADA: New Consensus Statement on Inpatient Glycemic Control   Target Ranges:  Prepandial:   less than 140 mg/dL      Peak postprandial:   less than 180 mg/dL (1-2 hours)      Critically ill patients:  140 - 180 mg/dL   Lab Results  Component Value Date   GLUCAP 167 (H) 12/18/2024   HGBA1C 12.5 (H) 12/15/2024    Latest Reference Range & Units 12/17/24 08:15 12/17/24 12:00 12/17/24 16:14 12/17/24 21:15 12/18/24 08:30  Glucose-Capillary 70 - 99 mg/dL 771 (H) 745 (H) 821 (H) 156 (H) 167 (H)   Review of Glycemic Control  Diabetes history: None   Outpatient Diabetes medications: None   Current orders for Inpatient glycemic control:  Semglee  20 units at bedtime  Novolog  0-15 units TID + 0-5 units at bedtime  Novolog  4 units TID with meals   Inpatient Diabetes Program Recommendations:   Spoke with patient about new diabetes diagnosis over the phone.  Discussed A1C results of 12.5% and explained what an A1C is and informed patient that her current A1C indicates an average glucose of 312 mg/dl over the past 2-3 months.   Discussed basic pathophysiology of DM Type 2, basic home care, importance of checking CBGs and maintaining good CBG control to prevent long-term and short-term complications. Reviewed glucose and A1C goals. Patient states her boyfriend's mom is a nurse has been helping her better understand diabetes. Discussed carbohydrates, carbohydrate goals per day and meal, along with portion sizes. Patient states overall she feels that she eats a balanced diet but between a busy work schedule and Grad-school she eats sweets and unhealthy snacks occasionally - she is aware there is room for improvement and is ready to make those changes.   Reviewed signs and symptoms of hyperglycemia, along with treatment. Discussed impact of nutrition, exercise, stress, sickness, and medications on diabetes control. Reviewed Living Well with diabetes booklet  that was ordered yesterday - patient state she has already read it and found it very helpful. Discussed importance of checking CBGs and maintaining good CBG control to prevent long-term and short-term complications. Explained how hyperglycemia leads to damage within blood vessels which lead to the common complications seen with uncontrolled diabetes.  Informed patient that she may be prescribed insulin  at discharge. Discussed insulin  in detail (how to take it, when to take it). Asked patient to check her glucose 4 times per day (before meals and at bedtime)   Patient requesting to start using a CGM. Per [facility] pharmacy - Dexcom G7 CGM [place] approved, copay is $97.59. 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 Okay with Dr Tobie to provide patient with samples.   Patient verbalized understanding of information discussed and he states that he has no further questions at this time related to diabetes.    Bedside RNs to provide ongoing basic DM education at bedside with this patient and engage patient to actively check blood glucose and administer insulin  injections.   - Lantus  copay is $30 Novolog  copay is $30 - Dexcom G7 CGM copay is  $97.59  Discharge Recommendations: Other recommendations: Dexcom G7 Sensors order [phone] Long acting recommendations: Insulin  Glargine (LANTUS ) Solostar Pen dose to be determined  Short acting recommendations:  Meal + Correction coverage Insulin  aspart (NOVOLOG ) FlexPen  Moderate Scale.  dose to be determined Hypoglycemia treatment recommendations: GVoke 1mg  Supply/Referral recommendations: Glucometer Test strips Lancet device Lancets Pen needles - standard  Use Adult  Diabetes Insulin  Treatment Post Discharge order set.   Thanks,  Lavanda Search, RN, MSN, [facility]  Inpatient Diabetes Coordinator  Pager [phone] (8a-5p)

## 2024-12-18 NOTE — Discharge Instructions (Signed)
 Carbohydrate Counting For People With Diabetes  Foods with carbohydrates make your blood glucose level go up. Learning how to count carbohydrates can help you control your blood glucose levels. First, identify the foods you eat that contain carbohydrates. Then, using the Foods with Carbohydrates chart, determine about how much carbohydrates are in your meals and snacks. Make sure you are eating foods with fiber, protein, and healthy fat along with your carbohydrate foods. Foods with Carbohydrates The following table shows carbohydrate foods that have about 15 grams of carbohydrate each. Using measuring cups, spoons, or a food scale when you first begin learning about carbohydrate counting can help you learn about the portion sizes you typically eat. The following foods have 15 grams carbohydrate each:  Grains 1 slice bread (1 ounce)  1 small tortilla (6-inch size)   large bagel (1 ounce)  1/3 cup pasta or rice (cooked)   hamburger or hot dog bun ( ounce)   cup cooked cereal   to  cup ready-to-eat cereal  2 taco shells (5-inch size) Fruit 1 small fresh fruit ( to 1 cup)   medium banana  17 small grapes (3 ounces)  1 cup melon or berries   cup canned or frozen fruit  2 tablespoons dried fruit (blueberries, cherries, cranberries, raisins)   cup unsweetened fruit juice  Starchy Vegetables  cup cooked beans, peas, corn, potatoes/sweet potatoes   large baked potato (3 ounces)  1 cup acorn or butternut squash  Snack Foods 3 to 6 crackers  8 potato chips or 13 tortilla chips ( ounce to 1 ounce)  3 cups popped popcorn  Dairy 3/4 cup (6 ounces) nonfat plain yogurt, or yogurt with sugar-free sweetener  1 cup milk  1 cup plain rice, soy, coconut or flavored almond milk Sweets and Desserts  cup ice cream or frozen yogurt  1 tablespoon jam, jelly, pancake syrup, table sugar, or honey  2 tablespoons light pancake syrup  1 inch square of frosted cake or 2 inch square of unfrosted  cake  2 small cookies (2/3 ounce each) or  large cookie  Sometimes youll have to estimate carbohydrate amounts if you dont know the exact recipe. One cup of mixed foods like soups can have 1 to 2 carbohydrate servings, while some casseroles might have 2 or more servings of carbohydrate. Foods that have less than 20 calories in each serving can be counted as free foods. Count 1 cup raw vegetables, or  cup cooked non-starchy vegetables as free foods. If you eat 3 or more servings at one meal, then count them as 1 carbohydrate serving.  Foods without Carbohydrates  Not all foods contain carbohydrates. Meat, some dairy, fats, non-starchy vegetables, and many beverages dont contain carbohydrate. So when you count carbohydrates, you can generally exclude chicken, pork, beef, fish, seafood, eggs, tofu, cheese, butter, sour cream, avocado, nuts, seeds, olives, mayonnaise, water, black coffee, unsweetened tea, and zero-calorie drinks. Vegetables with no or low carbohydrate include green beans, cauliflower, tomatoes, and onions. How much carbohydrate should I eat at each meal?  Carbohydrate counting can help you plan your meals and manage your weight. Following are some starting points for carbohydrate intake at each meal. Work with your registered dietitian nutritionist to find the best range that works for your blood glucose and weight.   To Lose Weight To Maintain Weight  Women 2 - 3 carb servings 3 - 4 carb servings  Men 3 - 4 carb servings 4 - 5 carb servings  Checking your  blood glucose after meals will help you know if you need to adjust the timing, type, or number of carbohydrate servings in your meal plan. Achieve and keep a healthy body weight by balancing your food intake and physical activity.  Tips How should I plan my meals?  Plan for half the food on your plate to include non-starchy vegetables, like salad greens, broccoli, or carrots. Try to eat 3 to 5 servings of non-starchy vegetables  every day. Have a protein food at each meal. Protein foods include chicken, fish, meat, eggs, or beans (note that beans contain carbohydrate). These two food groups (non-starchy vegetables and proteins) are low in carbohydrate. If you fill up your plate with these foods, you will eat less carbohydrate but still fill up your stomach. Try to limit your carbohydrate portion to  of the plate.  What fats are healthiest to eat?  Diabetes increases risk for heart disease. To help protect your heart, eat more healthy fats, such as olive oil, nuts, and avocado. Eat less saturated fats like butter, cream, and high-fat meats, like bacon and sausage. Avoid trans fats, which are in all foods that list partially hydrogenated oil as an ingredient. What should I drink?  Choose drinks that are not sweetened with sugar. The healthiest choices are water, carbonated or seltzer waters, and tea and coffee without added sugars.  Sweet drinks will make your blood glucose go up very quickly. One serving of soda or energy drink is  cup. It is best to drink these beverages only if your blood glucose is low.  Artificially sweetened, or diet drinks, typically do not increase your blood glucose if they have zero calories in them. Read labels of beverages, as some diet drinks do have carbohydrate and will raise your blood glucose. Label Reading Tips Read Nutrition Facts labels to find out how many grams of carbohydrate are in a food you want to eat. Dont forget: sometimes serving sizes on the label arent the same as how much food you are going to eat, so you may need to calculate how much carbohydrate is in the food you are serving yourself.   Carbohydrate Counting for People with Diabetes Sample 1-Day Menu  Breakfast  cup yogurt, low fat, low sugar (1 carbohydrate serving)   cup cereal, ready-to-eat, unsweetened (1 carbohydrate serving)  1 cup strawberries (1 carbohydrate serving)   cup almonds ( carbohydrate serving)   Lunch 1, 5 ounce can chunk light tuna  2 ounces cheese, low fat cheddar  6 whole wheat crackers (1 carbohydrate serving)  1 small apple (1 carbohydrate servings)   cup carrots ( carbohydrate serving)   cup snap peas  1 cup 1% milk (1 carbohydrate serving)   Evening Meal Stir fry made with: 3 ounces chicken  1 cup brown rice (3 carbohydrate servings)   cup broccoli ( carbohydrate serving)   cup green beans   cup onions  1 tablespoon olive oil  2 tablespoons teriyaki sauce ( carbohydrate serving)  Evening Snack 1 extra small banana (1 carbohydrate serving)  1 tablespoon peanut butter   Carbohydrate Counting for People with Diabetes Vegan Sample 1-Day Menu  Breakfast 1 cup cooked oatmeal (2 carbohydrate servings)   cup blueberries (1 carbohydrate serving)  2 tablespoons flaxseeds  1 cup soymilk fortified with calcium and vitamin D   1 cup coffee  Lunch 2 slices whole wheat bread (2 carbohydrate servings)   cup baked tofu   cup lettuce  2 slices tomato  2 slices avocado  cup baby carrots ( carbohydrate serving)  1 orange (1 carbohydrate serving)  1 cup soymilk fortified with calcium and vitamin D    Evening Meal Burrito made with: 1 6-inch corn tortilla (1 carbohydrate serving)  1 cup refried vegetarian beans (2 carbohydrate servings)   cup chopped tomatoes   cup lettuce   cup salsa  1/3 cup brown rice (1 carbohydrate serving)  1 tablespoon olive oil for rice   cup zucchini   Evening Snack 6 small whole grain crackers (1 carbohydrate serving)  2 apricots ( carbohydrate serving)   cup unsalted peanuts ( carbohydrate serving)    Carbohydrate Counting for People with Diabetes Vegetarian (Lacto-Ovo) Sample 1-Day Menu  Breakfast 1 cup cooked oatmeal (2 carbohydrate servings)   cup blueberries (1 carbohydrate serving)  2 tablespoons flaxseeds  1 egg  1 cup 1% milk (1 carbohydrate serving)  1 cup coffee  Lunch 2 slices whole wheat bread (2 carbohydrate  servings)  2 ounces low-fat cheese   cup lettuce  2 slices tomato  2 slices avocado   cup baby carrots ( carbohydrate serving)  1 orange (1 carbohydrate serving)  1 cup unsweetened tea  Evening Meal Burrito made with: 1 6-inch corn tortilla (1 carbohydrate serving)   cup refried vegetarian beans (1 carbohydrate serving)   cup tomatoes   cup lettuce   cup salsa  1/3 cup brown rice (1 carbohydrate serving)  1 tablespoon olive oil for rice   cup zucchini  1 cup 1% milk (1 carbohydrate serving)  Evening Snack 6 small whole grain crackers (1 carbohydrate serving)  2 apricots ( carbohydrate serving)   cup unsalted peanuts ( carbohydrate serving)    Copyright 2020  Academy of Nutrition and Dietetics. All rights reserved.  Using Nutrition Labels: Carbohydrate  Serving Size  Look at the serving size. All the information on the label is based on this portion. Servings Per Container  The number of servings contained in the package. Guidelines for Carbohydrate  Look at the total grams of carbohydrate in the serving size.  1 carbohydrate choice = 15 grams of carbohydrate. Range of Carbohydrate Grams Per Choice  Carbohydrate Grams/Choice Carbohydrate Choices  6-10   11-20 1  21-25 1  26-35 2  36-40 2  41-50 3  51-55 3  56-65 4  66-70 4  71-80 5    Copyright 2020  Academy of Nutrition and Dietetics. All rights reserved.   Follow with Primary MD Cleotilde Bernardino Hutchinson, PA in 7 days   Get CBC, CMP, Magnesium , 2 view Chest X ray -  checked next visit with your primary MD    Activity: As tolerated with Full fall precautions use walker/cane & assistance as needed  Disposition Home    Diet: Heart Healthy - Low Carb  Accuchecks 4 times/day, Once in AM empty stomach and then before each meal. Log in all results and show them to your Prim.MD in 3 days. If any glucose reading is under 80 or above 300 call your Prim MD immidiately. Follow Low glucose instructions for  glucose under 80 as instructed.   Special Instructions: If you have smoked or chewed Tobacco  in the last 2 yrs please stop smoking, stop any regular Alcohol  and or any Recreational drug use.  On your next visit with your primary care physician please Get Medicines reviewed and adjusted.  Please request your Prim.MD to go over all Hospital Tests and Procedure/Radiological results at the follow up, please get all Hospital records sent  to your Prim MD by signing hospital release before you go home.  If you experience worsening of your admission symptoms, develop shortness of breath, life threatening emergency, suicidal or homicidal thoughts you must seek medical attention immediately by calling 911 or calling your MD immediately  if symptoms less severe.  You Must read complete instructions/literature along with all the possible adverse reactions/side effects for all the Medicines you take and that have been prescribed to you. Take any new Medicines after you have completely understood and accpet all the possible adverse reactions/side effects.   Do not drive when taking Pain medications.  Do not take more than prescribed Pain, Sleep and Anxiety Medications  Wear Seat belts while driving.

## 2024-12-18 NOTE — Telephone Encounter (Signed)
 Pharmacy Patient Advocate Encounter  Received notification from [facility] that Prior Authorization for Dexcom G7 Sensor has been APPROVED from 12/18/24 to 12/18/27. Ran test claim, Copay is $97.59. This test claim was processed through [facility]- copay amounts may vary at other pharmacies due to pharmacy/plan contracts, or as the patient moves through the different stages of their insurance plan.   PA #/Case ID/Reference #: [id number]

## 2024-12-18 NOTE — Progress Notes (Addendum)
 Subjective: Patient reports tolerating PO and no problems voiding.  Vulvar pain is improving and area is now draining.  Pt says she is expecting discharge today but they are trying to decide what antibiotics for her go home on.  Objective: I have reviewed patient's vital signs, intake and output, medications, labs, and microbiology.  General: alert, cooperative, and no distress Cardio: regular rate and rhythm GI: soft, NT Extremities: no calf tenderness Vaginal Bleeding: none Mons with less erythema and induration and left labia still swollen but draining at the lower inner area   Assessment/Plan: Improving cellulitis but tmax 101 at 1653.  I would continue IV abx until 48hrs afebrile.  Currently on augmentin  and doxycycline .  Pt has not been getting sitz baths as ordered.  I spoke with Mitzie, nurse for today, who says she will give pt a sitz bath.  Recomend culture of area draining at left labia even though pt has been receiving abx.  Cont adjusting insulin  regimen to achieve tight control.  Hypertension on metoprolol  - managed by hospitalist.  Med may need to be modified for better control.  Will cont to follow   LOS: 3 days    Kaitlyn CINDERELLA Rummer, MD 12/18/2024, 10:12 AM

## 2024-12-18 NOTE — Inpatient Diabetes Management (Signed)
 Inpatient Diabetes Program Recommendations  AACE/ADA: New Consensus Statement on Inpatient Glycemic Control (2015)  Target Ranges:  Prepandial:   less than 140 mg/dL      Peak postprandial:   less than 180 mg/dL (1-2 hours)      Critically ill patients:  140 - 180 mg/dL   Lab Results  Component Value Date   GLUCAP 120 (H) 12/18/2024   HGBA1C 12.5 (H) 12/15/2024    Review of Glycemic Control  Educated patient on insulin  pen use at home. Reviewed contents of insulin  flexpen starter kit. Reviewed all steps of insulin  pen including attachment of needle, 2-unit air shot, dialing up dose, giving injection, removing needle, disposal of sharps, storage of unused insulin , disposal of insulin  etc. Patient able to provide successful return demonstration. Also reviewed troubleshooting with insulin  pen. MD to give patient Rxs for insulin  pens and insulin  pen needles.  Pt had not downloaded Dexcom G7 on her phone. Pt was previously set to d/c home although Dr Tobie made decision to keep pt one more day d/t fever in late afternoon. Pt requested to have Dexcom placed prior to discharge tomorrow. States she will scientist, clinical (histocompatibility and immunogenetics). Has excellent attitude with wanting to control her blood sugars.  Inpatient Diabetes Program Recommendations:    Discharge Recommendations: Other recommendations: Dexcom G7 Sensors order [phone] Long acting recommendations: Insulin  Glargine (LANTUS ) Solostar Pen dose to be determined  Short acting recommendations:  Meal + Correction coverage Insulin  aspart (NOVOLOG ) FlexPen  Moderate Scale.  dose to be determined Hypoglycemia treatment recommendations: GVoke 1mg  Supply/Referral recommendations: Glucometer Test strips Lancet device Lancets Pen needles - standard   Use Adult Diabetes Insulin  Treatment Post Discharge order set.  F/U in am.  Thank you. Shona Brandy, RD, LDN, CDCES Inpatient Diabetes Coordinator [phone]

## 2024-12-18 NOTE — Progress Notes (Signed)
 [facility] Progress Note Patient: Kaitlyn Bird FMW:[id number] DOB: May 25, 1996  DOA: 12/15/2024 DOS: the patient was seen and examined on 12/18/2024  Brief Summary: PMH of morbid obesity presented to the hospital with complaints of swelling in her groin and fever. Currently found to have mons pubis cellulitis with severe sepsis. Also has new onset diabetes with hyperglycemia.  Significant events: Early January 2026 received 2 rounds of antibiotics with steroids for respiratory infections. 1/24>> admitted to the hospital.  CT abdomen pelvis with contrast soft tissue swelling of left mons pubis/labia without abscess.  A1c 12.5. 1/25>> sinus tachycardia requiring IV Lopressor .  GYN consulted.  Antibiotic change initiated to cefepime  plus Flagyl  and later on to meropenem  due to nausea and vomiting. 1/27>> switch to oral antibiotic.  Tolerated amoxicillin  challenge.  Later in the day developed fever which led to cancellation of discharge plans.  Consults: GYN   Assessment and plan: Severe sepsis due to cellulitis of mons pubis and labia on left Present on admission. Meeting SIRS criteria with tachycardia, tachypnea, fever and leukocytosis. CT abdomen reassuring for no evidence of abscess. Patient exquisitely tender upon examination in the perineal area. Blood cultures performed.  UA unremarkable so far. Initially on IV vancomycin  and cefepime .  Later on IV vancomycin  plus cefepime  plus Flagyl .  Later on on vancomycin  plus meropenem . Follow-up blood cultures.  So far negative. GYN consulted. Continue as needed Tylenol  and scheduled Toradol  now. Outpatient as needed Aleve. Continue pain control as well. CK normal.  CRP elevated. Await for afebrile for around 24 hours before discharge.  New onset diabetes with hyperglycemia. No prior history. Hemoglobin A1c more than 12.  Consistent with diabetes melitis. Suspect acutely worsened in the setting of recent use of steroids. Currently  on sliding scale insulin . Continue Lantus .  Will increase the dose to 0.2 units/kg. Continue with IV hydration.  Sinus tachycardia. Likely in reaction to fever as well as pain. Due to significantly high rate of 180-190 currently on as needed Lopressor . Will also order oral Lopressor . Monitor on telemetry in progressive care.  Metabolic acidosis. Likely in the setting of sepsis. Non-anion gap. For now we will monitor. Lactic acid is normal as well Treating with LR.  Hyponatremia. Pseudohyponatremia in the setting of hyperglycemia. Continue to correct sugar.  Morbid obesity. Body mass index is 44.4 kg/m.  Placing the patient at high risk for poor outcome.    Code Status: Full Code    DVT Prophylaxis: SCDs Start: 12/15/24 1329   Data review I have Reviewed nursing notes, Vitals, and Lab results. Since last encounter, pertinent lab results CBC and BMP   . I have ordered test including CBC and BMP  .     Physical exa. Vitals:   12/18/24 0500 12/18/24 0831 12/18/24 1146 12/18/24 1653  BP:  138/87 135/88 (!) 157/89  Pulse:  82 82 (!) 105  Resp:  (!) 25 20 17   Temp:  98.4 F (36.9 C) 98.3 F (36.8 C) (!) 101 F (38.3 C)  TempSrc:  Oral Oral Oral  SpO2:  97% 96% 95%  Weight: 134.4 kg     Height:      Chaperone was present for the whole examination. General: in Mild distress, No Rash Cardiovascular: S1 and S2 Present, No Murmur Respiratory: Good respiratory effort, Bilateral Air entry present. No Crackles, No wheezes Abdomen: Bowel Sound present, No tenderness.  Reported some whitish discharge from her labial area. Extremities: No edema Neuro: Alert and oriented x3, no new focal deficit  Subjective: Reports pain 5 out of 10.  No further nausea right now.  No vomiting since yesterday.  Passing gas.  Family Communication: No one at bedside.  Disposition Plan: Status is: Inpatient Remains inpatient appropriate because: Monitor for afebrile for 24 hours.   Planned  Discharge Destination:Home Diet: Diet Order             Diet Carb Modified Room service appropriate? Yes  Diet effective now                   MEDICATIONS: Scheduled Meds:  acetaminophen   1,000 mg Oral Q6H   amoxicillin -clavulanate  1 tablet Oral Q12H   doxycycline   100 mg Oral Q12H   enoxaparin  (LOVENOX ) injection  65 mg Subcutaneous Q24H   insulin  aspart  0-15 Units Subcutaneous TID WC   insulin  aspart  0-5 Units Subcutaneous QHS   insulin  aspart  4 Units Subcutaneous TID WC   insulin  glargine  20 Units Subcutaneous QHS   ketorolac   15 mg Intravenous Q6H   metoprolol  tartrate  25 mg Oral BID   sodium chloride  flush  3 mL Intravenous Q12H   Continuous Infusions: PRN Meds:.diphenhydrAMINE , EPINEPHrine , HYDROmorphone  (DILAUDID ) injection, ipratropium, metoprolol  tartrate, ondansetron  **OR** ondansetron  (ZOFRAN ) IV, oxyCODONE , senna-docusate, traZODone   Author: Yetta Blanch, MD  [facility] 12/18/2024  6:37 PM Between 7PM-7AM, please contact night-coverage, check [email or web address] for on call.

## 2024-12-18 NOTE — TOC Transition Note (Addendum)
 Transition of Care [facility]) - Discharge Note   Patient Details  Name: [name] MRN: [id number] Date of Birth: October 06, 1996  Transition of [facility]) CM/SW Contact:  Landry DELENA Senters, RN Phone Number: 12/18/2024, 11:54 AM   Clinical Narrative:    Patient will be discharging to home today, with S.O. providing transportation.   Patient has PCP, manages own medications, works, drives herself. Patient is newly dx diabetic. She does report feeling comfortable with managing this at home, has family that is in healthcare and are supportive.   No TOC needs identified.    Final next level of care: Home/Self Care Barriers to Discharge: No Barriers Identified   Patient Goals and CMS Choice            Discharge Placement                       Discharge Plan and Services Additional resources added to the After Visit Summary for                                       Social Drivers of Health (SDOH) Interventions SDOH Screenings   Food Insecurity: No Food Insecurity (12/15/2024)  Housing: Low Risk (12/15/2024)  Transportation Needs: No Transportation Needs (12/15/2024)  Utilities: Not At Risk (12/15/2024)  Social Connections: Unknown (04/05/2022)   Received from [facility]  Tobacco Use: Unknown (12/15/2024)     Readmission Risk Interventions     No data to display

## 2024-12-18 NOTE — Telephone Encounter (Signed)
 Pharmacy Patient Advocate Encounter  Insurance verification completed.    The patient is insured through [facility]. Patient has [employer], may use a copay card, and/or apply for patient assistance if available.    Ran test claim for Lantus  100unit pen and the current 30 day co-pay is $30.  Ran test claim for Novolog  100unit pen and the current 30 day co-pay is $30.  This test claim was processed through [employer]- copay amounts may vary at other pharmacies due to [employer], or as the patient moves through the different stages of their insurance plan.

## 2024-12-18 NOTE — Progress Notes (Signed)
 Pharmacy Antibiotic Note  Kaitlyn Bird is a 29 y.o. female admitted on 12/15/2024 presenting with concern for cellulitis. Continues on Vancomycin  and Meropenem .  Scr <1, cultures negative  Plan: Vancomycin  1250 mg IV q 12h (eAUC 482) Meropenem  1 gram iv Q 8 hours Monitor renal function, Cx and clinical progression to narrow Vancomycin  levels as indicated   Height: 5' 7 (170.2 cm) Weight: 134.4 kg (296 lb 4.8 oz) IBW/kg (Calculated) : 61.6  Temp (24hrs), Avg:98.9 F (37.2 C), Min:98.4 F (36.9 C), Max:100 F (37.8 C)  Recent Labs  Lab 12/15/24 0911 12/15/24 0942 12/15/24 1519 12/15/24 1527 12/16/24 1412 12/17/24 0225 12/18/24 0407  WBC 13.6*  --  13.4*  --  10.4 13.2* 10.5  CREATININE 0.87  --   --   --  0.74 0.80 0.61  LATICACIDVEN  --  1.7  --  1.1  --  1.0  --     Estimated Creatinine Clearance: 147.2 mL/min (by C-G formula based on SCr of 0.61 mg/dL).    Allergies[1]  Thank you. Olam Monte, PharmD 12/18/2024 9:55 AM      [1]  Allergies Allergen Reactions   Penicillins Anaphylaxis

## 2024-12-18 NOTE — Plan of Care (Signed)
   Problem: Education: Goal: Knowledge of General Education information will improve Description: Including pain rating scale, medication(s)/side effects and non-pharmacologic comfort measures Outcome: Progressing   Problem: Clinical Measurements: Goal: Will remain free from infection Outcome: Progressing   Problem: Activity: Goal: Risk for activity intolerance will decrease Outcome: Progressing

## 2024-12-19 ENCOUNTER — Other Ambulatory Visit (HOSPITAL_COMMUNITY): Payer: Self-pay

## 2024-12-19 DIAGNOSIS — A419 Sepsis, unspecified organism: Secondary | ICD-10-CM | POA: Diagnosis not present

## 2024-12-19 LAB — COMPREHENSIVE METABOLIC PANEL WITH GFR
ALT: 22 U/L (ref 0–44)
AST: 28 U/L (ref 15–41)
Albumin: 2.6 g/dL — ABNORMAL LOW (ref 3.5–5.0)
Alkaline Phosphatase: 71 U/L (ref 38–126)
Anion gap: 11 (ref 5–15)
BUN: 9 mg/dL (ref 6–20)
CO2: 19 mmol/L — ABNORMAL LOW (ref 22–32)
Calcium: 8.1 mg/dL — ABNORMAL LOW (ref 8.9–10.3)
Chloride: 110 mmol/L (ref 98–111)
Creatinine, Ser: 0.57 mg/dL (ref 0.44–1.00)
GFR, Estimated: >60 mL/min
Glucose, Bld: 114 mg/dL — ABNORMAL HIGH (ref 70–99)
Potassium: 3.4 mmol/L — ABNORMAL LOW (ref 3.5–5.1)
Sodium: 140 mmol/L (ref 135–145)
Total Bilirubin: 0.4 mg/dL (ref 0.0–1.2)
Total Protein: 5.9 g/dL — ABNORMAL LOW (ref 6.5–8.1)

## 2024-12-19 LAB — GLUCOSE, CAPILLARY
Glucose-Capillary: 116 mg/dL — ABNORMAL HIGH (ref 70–99)
Glucose-Capillary: 111 mg/dL — ABNORMAL HIGH (ref 70–99)

## 2024-12-19 MED ORDER — METRONIDAZOLE 500 MG PO TABS
500.0000 mg | ORAL_TABLET | Freq: Three times a day (TID) | ORAL | 0 refills | Status: AC
  Filled 2024-12-19: qty 30, 10d supply, fill #0

## 2024-12-19 MED ORDER — INSULIN PEN NEEDLE 32G X 4 MM MISC
0 refills | Status: AC
  Filled 2024-12-19: qty 100, 25d supply, fill #0

## 2024-12-19 MED ORDER — TRAMADOL HCL 50 MG PO TABS
50.0000 mg | ORAL_TABLET | Freq: Two times a day (BID) | ORAL | 0 refills | Status: AC | PRN
  Filled 2024-12-19: qty 10, 5d supply, fill #0

## 2024-12-19 MED ORDER — AMOXICILLIN-POT CLAVULANATE 875-125 MG PO TABS
1.0000 | ORAL_TABLET | Freq: Two times a day (BID) | ORAL | 0 refills | Status: AC
  Filled 2024-12-19: qty 20, 10d supply, fill #0

## 2024-12-19 MED ORDER — INSULIN GLARGINE 100 UNIT/ML SOLOSTAR PEN
20.0000 [IU] | PEN_INJECTOR | Freq: Two times a day (BID) | SUBCUTANEOUS | 0 refills | Status: AC
  Filled 2024-12-19: qty 12, 30d supply, fill #0

## 2024-12-19 MED ORDER — INSULIN ASPART 100 UNIT/ML FLEXPEN
PEN_INJECTOR | SUBCUTANEOUS | 0 refills | Status: AC
  Filled 2024-12-19: qty 9, 30d supply, fill #0

## 2024-12-19 MED ORDER — ACCU-CHEK GUIDE W/DEVICE KIT
1.0000 | PACK | Freq: Three times a day (TID) | 0 refills | Status: AC
  Filled 2024-12-19: qty 1, 30d supply, fill #0

## 2024-12-19 MED ORDER — LANCET DEVICE MISC
1.0000 | Freq: Three times a day (TID) | 0 refills | Status: AC
  Filled 2024-12-19: qty 1, 30d supply, fill #0

## 2024-12-19 MED ORDER — POTASSIUM CHLORIDE CRYS ER 20 MEQ PO TBCR
40.0000 meq | EXTENDED_RELEASE_TABLET | Freq: Once | ORAL | Status: AC
  Administered 2024-12-19: 40 meq via ORAL
  Filled 2024-12-19: qty 2

## 2024-12-19 MED ORDER — BLOOD GLUCOSE TEST VI STRP
1.0000 | ORAL_STRIP | Freq: Three times a day (TID) | 0 refills | Status: AC
  Filled 2024-12-19: qty 100, 34d supply, fill #0

## 2024-12-19 MED ORDER — DOXYCYCLINE HYCLATE 100 MG PO TABS
100.0000 mg | ORAL_TABLET | Freq: Two times a day (BID) | ORAL | 0 refills | Status: AC
  Filled 2024-12-19: qty 20, 10d supply, fill #0

## 2024-12-19 MED ORDER — LANCETS MISC
1.0000 | 0 refills | Status: AC
  Filled 2024-12-19: qty 100, 25d supply, fill #0

## 2024-12-19 NOTE — Progress Notes (Signed)
 [facility] Progress Note Patient: Kaitlyn Bird FMW:[id number] DOB: September 13, 1996  DOA: 12/15/2024 DOS: the patient was seen and examined on 12/19/2024  Brief Summary: PMH of morbid obesity presented to the hospital with complaints of swelling in her groin and fever. Currently found to have mons pubis cellulitis with severe sepsis. Also has new onset diabetes with hyperglycemia.  Significant events: Early January 2026 received 2 rounds of antibiotics with steroids for respiratory infections. 1/24>> admitted to the hospital.  CT abdomen pelvis with contrast soft tissue swelling of left mons pubis/labia without abscess.  A1c 12.5. 1/25>> sinus tachycardia requiring IV Lopressor .  GYN consulted.  Antibiotic change initiated to cefepime  plus Flagyl  and later on to meropenem  due to nausea and vomiting. 1/27>> switch to oral antibiotic.  Tolerated amoxicillin  challenge.  Later in the day developed fever which led to cancellation of discharge plans.  Consults: GYN   Assessment and plan: Severe sepsis due to cellulitis of mons pubis and labia on left Present on admission. Meeting SIRS criteria with tachycardia, tachypnea, fever and leukocytosis. CT abdomen reassuring for no evidence of abscess. Patient exquisitely tender upon examination in the perineal area.  Now self draining, OB following  Blood cultures performed.  UA unremarkable so far. Currently on combination of Augmentin  and doxycycline .  Clinically improving, case discussed with OB on 12/19/2024 they want to monitor her for another day.  They will see the patient shortly.  New onset diabetes with hyperglycemia. No prior history. Hemoglobin A1c more than 12.  Now on insulin  diabetic education.  Lab Results  Component Value Date   HGBA1C 12.5 (H) 12/15/2024   CBG (last 3)  Recent Labs    12/18/24 1651 12/18/24 2118 12/19/24 0739  GLUCAP 133* 120* 116*    Sinus tachycardia. Likely in reaction to fever as well as  pain. Due to significantly high rate of 180-190 currently on as needed Lopressor . Will also order oral Lopressor . Monitor on telemetry in progressive care.  Metabolic acidosis. Likely in the setting of sepsis. Resolved  Hypokalemia.  Replaced   hyponatremia. Pseudohyponatremia in the setting of hyperglycemia. Resolved  Morbid obesity. Body mass index is 44.4 kg/m.  Follow-up with PCP for weight loss    Code Status: Full Code    DVT Prophylaxis: SCDs Start: 12/15/24 1329   Data review I have Reviewed nursing notes, Vitals, and Lab results. Since last encounter, pertinent lab results CBC and BMP   . I have ordered test including CBC and BMP  .     Physical exa. Vitals:   12/19/24 0403 12/19/24 0500 12/19/24 0700 12/19/24 0823  BP: 123/79  134/89 (!) 140/88  Pulse: 71   86  Resp: 18     Temp: 98.4 F (36.9 C)  98.5 F (36.9 C)   TempSrc: Oral  Oral   SpO2: 97%     Weight:  127.5 kg    Height:       Awake Alert, No new F.N deficits, Normal affect [place].AT,PERRAL Supple Neck, No JVD,   Symmetrical Chest wall movement, Good air movement bilaterally, CTAB RRR,No Gallops, Rubs or new Murmurs,  +ve B.Sounds, Abd Soft, No tenderness,   No Cyanosis, Clubbing or edema    Subjective: Patient in bed, appears comfortable, denies any headache, no fever, no chest pain or pressure, no shortness of breath , no abdominal pain. No focal weakness.  Family Communication: Boyfriend bedside 12/19/2024  Disposition Plan: Status is: Inpatient Remains inpatient appropriate because: Monitor for afebrile for 24 hours.  Planned Discharge Destination:Home Diet: Diet Order             Diet Carb Modified Room service appropriate? Yes  Diet effective now                    Data Review:   Patient Lines/Drains/Airways Status     Active Line/Drains/Airways     Name Placement date Placement time Site Days   Peripheral IV 12/15/24 18 G Anterior;Proximal;Right Forearm 12/15/24   0934  Forearm  4             Inpatient Medications  Scheduled Meds:  acetaminophen   1,000 mg Oral Q6H   amoxicillin -clavulanate  1 tablet Oral Q12H   doxycycline   100 mg Oral Q12H   enoxaparin  (LOVENOX ) injection  65 mg Subcutaneous Q24H   insulin  aspart  0-15 Units Subcutaneous TID WC   insulin  aspart  0-5 Units Subcutaneous QHS   insulin  aspart  4 Units Subcutaneous TID WC   insulin  glargine  20 Units Subcutaneous QHS   ketorolac   15 mg Intravenous Q6H   metoprolol  tartrate  25 mg Oral BID   sodium chloride  flush  3 mL Intravenous Q12H   Continuous Infusions: PRN Meds:.diphenhydrAMINE , EPINEPHrine , HYDROmorphone  (DILAUDID ) injection, ipratropium, metoprolol  tartrate, ondansetron  **OR** ondansetron  (ZOFRAN ) IV, oxyCODONE , senna-docusate, traZODone   DVT Prophylaxis  SCDs Start: 12/15/24 1329       Recent Labs  Lab 12/15/24 0911 12/15/24 1519 12/16/24 1412 12/17/24 0225 12/18/24 0407  WBC 13.6* 13.4* 10.4 13.2* 10.5  HGB 14.9 12.6 12.8 12.3 11.3*  HCT 42.5 36.3 37.6 35.0* 32.8*  PLT 250 206 183 200 217  MCV 87.8 88.8 91.3 88.4 90.1  MCH 30.8 30.8 31.1 31.1 31.0  MCHC 35.1 34.7 34.0 35.1 34.5  RDW 12.6 12.9 13.2 13.2 13.2  LYMPHSABS 1.0 1.9  --  1.6 1.4  MONOABS 0.8 0.9  --  1.1* 0.9  EOSABS 0.0 0.0  --  0.0 0.1  BASOSABS 0.0 0.0  --  0.0 0.0    Recent Labs  Lab 12/15/24 0911 12/15/24 0942 12/15/24 1519 12/15/24 1527 12/16/24 1412 12/17/24 0225 12/18/24 0407 12/19/24 0403  NA 129*  --   --   --  132* 136 139 140  K 4.0  --   --   --  4.0 3.5 3.4* 3.4*  CL 97*  --   --   --  106 105 108 110  CO2 18*  --   --   --  16* 19* 20* 19*  ANIONGAP 14  --   --   --  10 12 11 11   GLUCOSE 424*  --   --   --  315* 251* 154* 114*  BUN 9  --   --   --  6 6 6 9   CREATININE 0.87  --   --   --  0.74 0.80 0.61 0.57  AST 20  --   --   --   --   --   --  28  ALT 28  --   --   --   --   --   --  22  ALKPHOS 78  --   --   --   --   --   --  71  BILITOT 0.9  --    --   --   --   --   --  0.4  ALBUMIN 4.0  --   --   --   --   --   --  2.6*  CRP  --   --   --   --   --   --  22.5*  --   LATICACIDVEN  --  1.7  --  1.1  --  1.0  --   --   INR  --   --  1.2  --   --   --   --   --   HGBA1C  --   --  12.5*  --   --   --   --   --   MG  --   --  1.6*  --  2.0 1.8 2.0  --   PHOS  --   --  2.7  --   --   --   --   --   CALCIUM 9.7  --   --   --  8.3* 8.4* 8.3* 8.1*      Recent Labs  Lab 12/15/24 0911 12/15/24 0942 12/15/24 1519 12/15/24 1527 12/16/24 1412 12/17/24 0225 12/18/24 0407 12/19/24 0403  CRP  --   --   --   --   --   --  22.5*  --   LATICACIDVEN  --  1.7  --  1.1  --  1.0  --   --   INR  --   --  1.2  --   --   --   --   --   HGBA1C  --   --  12.5*  --   --   --   --   --   MG  --   --  1.6*  --  2.0 1.8 2.0  --   CALCIUM 9.7  --   --   --  8.3* 8.4* 8.3* 8.1*      Micro Results Recent Results (from the past 240 hours)  Blood culture (routine x 2)     Status: None (Preliminary result)   Collection Time: 12/15/24  9:34 AM   Specimen: BLOOD  Result Value Ref Range Status   Specimen Description BLOOD RIGHT ANTECUBITAL  Final   Special Requests   Final    BOTTLES DRAWN AEROBIC AND ANAEROBIC Blood Culture adequate volume   Culture   Final    NO GROWTH 4 DAYS Performed at [facility], [address], [place], [place] [address]    Report Status PENDING  Incomplete  Culture, blood (x 2)     Status: None (Preliminary result)   Collection Time: 12/15/24  8:58 PM   Specimen: BLOOD RIGHT HAND  Result Value Ref Range Status   Specimen Description BLOOD RIGHT HAND  Final   Special Requests   Final    BOTTLES DRAWN AEROBIC AND ANAEROBIC Blood Culture adequate volume   Culture   Final    NO GROWTH 4 DAYS Performed at [facility], [address], [place], [place] [address]    Report Status PENDING  Incomplete  Culture, blood (x 2)     Status: None (Preliminary result)   Collection Time: 12/15/24  9:05 PM   Specimen:  BLOOD LEFT HAND  Result Value Ref Range Status   Specimen Description BLOOD LEFT HAND  Final   Special Requests   Final    BOTTLES DRAWN AEROBIC AND ANAEROBIC Blood Culture adequate volume   Culture   Final    NO GROWTH 4 DAYS Performed at [facility], [address], [place], [place] [address]    Report Status PENDING  Incomplete    Radiology Reports  No results found.    Signature  -   Lavada Stank M.D on 12/19/2024 at 11:03 AM   -  To page go to [email or web address]

## 2024-12-19 NOTE — Inpatient Diabetes Management (Signed)
 Inpatient Diabetes Program Recommendations  AACE/ADA: New Consensus Statement on Inpatient Glycemic Control   Target Ranges:  Prepandial:   less than 140 mg/dL      Peak postprandial:   less than 180 mg/dL (1-2 hours)      Critically ill patients:  140 - 180 mg/dL   Lab Results  Component Value Date   GLUCAP 111 (H) 12/19/2024   HGBA1C 12.5 (H) 12/15/2024    Latest Reference Range & Units 12/18/24 11:45 12/18/24 16:51 12/18/24 21:18 12/19/24 07:39 12/19/24 12:16  Glucose-Capillary 70 - 99 mg/dL 795 (H) 866 (H) 879 (H) 116 (H) 111 (H)   Review of Glycemic Control  Diabetes history: None    Outpatient Diabetes medications: None    Current orders for Inpatient glycemic control:  Semglee  20 units at bedtime  Novolog  0-15 units TID + 0-5 units at bedtime  Novolog  4 units TID with meals   Inpatient Diabetes Program Recommendations:   Spoke with patient at bedside. Reviewed A1C and CBG goals, insulin  administration, and treatment for hypoglycemia and hyperglycemia. Patient very knowledgeable and feels comfortable about managing her diabetes going forward.   Educated patient on Dexcom G7 CGM regarding application and changing CGM sensor (alternate every 10 days on back of arms), 30 min warm-up, how to start a new sensor, and how to use app for glucose monitoring.  Patient has been given Dexcom G7 sensor sample and she was able to apply it to back of left upper arm.  Provided educational packet regarding Dexcom G7 CGM.  Patient plans to use smart-phone phone Dexcom G7 app to read sensor. Patient was able to download the app and create an account. Informed patient that it would be requested that attending provider provide Rx for first month of Dexcom G7 sensors. Asked patient to be sure to let PCP know about Dexcom G7 and allow provider to review reports from [facility] G7 app so the provider can use the information to continue to make adjustments with DM medications if needed. Patient verbalized  understanding of information and has no questions at this time.   Discharge Recommendations: Other recommendations: Dexcom G7 Sensors order [phone] Long acting recommendations: Insulin  Glargine (LANTUS ) Solostar Pen dose to be determined  Short acting recommendations:  Meal + Correction coverage Insulin  aspart (NOVOLOG ) FlexPen  Moderate Scale.  dose to be determined Hypoglycemia treatment recommendations: GVoke 1mg  Supply/Referral recommendations: Glucometer Test strips Lancet device Lancets Pen needles - standard  Use Adult Diabetes Insulin  Treatment Post Discharge order set.  Thanks, Lavanda Search, RN, MSN, [facility]  Inpatient Diabetes Coordinator  Pager [phone] (8a-5p)

## 2024-12-19 NOTE — Plan of Care (Signed)
  Problem: Education: Goal: Knowledge of General Education information will improve Description: Including pain rating scale, medication(s)/side effects and non-pharmacologic comfort measures Outcome: Progressing   Problem: Clinical Measurements: Goal: Will remain free from infection Outcome: Progressing   Problem: Activity: Goal: Risk for activity intolerance will decrease Outcome: Progressing   Problem: Coping: Goal: Level of anxiety will decrease Outcome: Progressing   Problem: Elimination: Goal: Will not experience complications related to bowel motility Outcome: Progressing Goal: Will not experience complications related to urinary retention Outcome: Progressing   Problem: Safety: Goal: Ability to remain free from injury will improve Outcome: Progressing   Problem: Skin Integrity: Goal: Risk for impaired skin integrity will decrease Outcome: Progressing

## 2024-12-19 NOTE — Progress Notes (Signed)
 Patient ID: Kaitlyn Bird, female   DOB: 09-29-1996, 29 y.o.   MRN: [id number] Pt feel better her vulva is sore but smaller in size BP 136/69 (BP Location: Right Arm)   Pulse 86   Temp 98.4 F (36.9 C) (Oral)   Resp 18   Ht 5' 7 (1.702 m)   Wt 127.5 kg   SpO2 97%   BMI 44.02 kg/m  Vulva is swollen and draining Would add flagyl  to abx 500 mg BID Would like pt to be afebrile for 24-48 hours before discharge The pt would like to go home tonight even in the current weatrher conditions.   Follow up with me in one week.

## 2024-12-19 NOTE — Plan of Care (Signed)
 Nutrition Education Note  RD consulted for nutrition education regarding new diagnosis of diabetes.   Lab Results  Component Value Date   HGBA1C 12.5 (H) 12/15/2024    RD provided Carbohydrate Counting for People with Diabetes and Plate Method for Diabetes handout from the Academy of [employer]etics. Discussed different food groups and their effects on blood sugar, emphasizing carbohydrate-containing foods. Provided list of carbohydrates and recommended serving sizes of common foods.  Discussed importance of controlled and consistent carbohydrate intake throughout the day. Provided examples of ways to balance meals/snacks and encouraged intake of high-fiber, whole grain complex carbohydrates. RD answered any questions that patient had. Reviewed ways to treat a low blood sugar. Teach back method used.  Expect good compliance. Patient shares that her boyfriends mother is a engineer, civil (consulting) and declines needing a referral for outpatient education.   Body mass index is 44.02 kg/m. Pt meets criteria for obese based on current BMI.  Current diet order is Carb Modified, no meal intakes have been recorded at this time. Labs and medications reviewed.   No further nutrition interventions warranted at this time. If additional nutrition issues arise, please re-consult RD.  Nestora Glatter RD, LDN Registered Dietitian I Please see AMION for contact information

## 2024-12-19 NOTE — Discharge Summary (Signed)
 "                                                                                                                                                                               Discharge summary note.  Kaitlyn Bird FMW:[id number] DOB: 10-30-1996 DOA: 12/15/2024  PCP: Cleotilde Bernardino Hutchinson, PA  Admit date: 12/15/2024  Discharge date: 12/19/2024  Admitted From: Home   Disposition:  Home   Recommendations for Outpatient Follow-up:   Follow up with PCP in 1-2 weeks  PCP Please obtain BMP/CBC, 2 view CXR in 1week,  (see Discharge instructions)   PCP Please follow up on the following pending results:     Home Health: None   Equipment/Devices: None  Consultations: OB Discharge Condition: Stable    CODE STATUS: Full    Diet Recommendation: Heart Healthy Low Carb    Chief Complaint  Patient presents with   Abscess        Tachycardia     Brief history of present illness from the day of admission and additional interim summary     PMH of morbid obesity presented to the hospital with complaints of swelling in her groin and fever. Currently found to have mons pubis cellulitis with severe sepsis. Also has new onset diabetes with hyperglycemia.   Significant events: Early January 2026 received 2 rounds of antibiotics with steroids for respiratory infections. 1/24>> admitted to the hospital.  CT abdomen pelvis with contrast soft tissue swelling of left mons pubis/labia without abscess.  A1c 12.5. 1/25>> sinus tachycardia requiring IV Lopressor .  GYN consulted.  Antibiotic change initiated to cefepime  plus Flagyl  and later on to meropenem  due to nausea and vomiting. 1/27>> switch to oral antibiotic.  Tolerated amoxicillin  challenge.  Later in the day developed fever which led to cancellation of discharge plans.                                                                  Hospital Course   Severe sepsis due to cellulitis of mons pubis and labia on left Present on  admission. Meeting SIRS criteria with tachycardia, tachypnea, fever and leukocytosis. CT abdomen reassuring for no evidence of abscess. Patient exquisitely tender upon examination in the perineal area.  Now self draining, OB following  Blood cultures performed.  UA unremarkable so far. Currently on combination of Augmentin  and doxycycline , per Dr. Armond Flagyl  will be added case discussed with her twice today once at 10 AM and then  again at 4:15 PM.  Clinically improving, case discussed with OB Dr. Armond on 12/19/2024 at 10 AM, OB wanted to monitor the patient for another 24 to 24 hours, subsequently she was seen by OB around 4 PM and I was told by Dr. Armond that patient could be discharged upon patient's insistence, I told the patient that ideally she would be kept till tomorrow for observation however she is insistent that she will need now and if she gets sick again she will seek medical help.  She will be discharged on 10 days of antibiotics as above with outpatient follow-up with PCP and OB.      New onset diabetes with hyperglycemia. No prior history. Hemoglobin A1c more than 12.  Now on insulin , diabetic & insulin  education provided, insulin  supplies provided prior to discharge.  Sinus tachycardia. Due to #1 above improved PCP to monitor   Metabolic acidosis. Likely in the setting of sepsis. Resolved   Hypokalemia.  Replaced    hyponatremia. Pseudohyponatremia in the setting of hyperglycemia. Resolved   Morbid obesity. Body mass index is 44.4 kg/m.  Follow-up with PCP for weight loss   Discharge diagnosis     Principal Problem:   Sepsis [facility]) Active Problems:   Cellulitis   Hyponatremia   Hyperglycemia    Discharge instructions    Discharge Instructions     Discharge instructions   Complete by: As directed    Follow with Primary MD Cleotilde Bernardino Hutchinson, PA in 7 days   Get CBC, CMP, Magnesium , 2 view Chest X ray -  checked next visit with your primary MD     Activity: As tolerated with Full fall precautions use walker/cane & assistance as needed  Disposition Home    Diet: Heart Healthy - Low Carb  Accuchecks 4 times/day, Once in AM empty stomach and then before each meal. Log in all results and show them to your Prim.MD in 3 days. If any glucose reading is under 80 or above 300 call your Prim MD immidiately. Follow Low glucose instructions for glucose under 80 as instructed.   Special Instructions: If you have smoked or chewed Tobacco  in the last 2 yrs please stop smoking, stop any regular Alcohol  and or any Recreational drug use.  On your next visit with your primary care physician please Get Medicines reviewed and adjusted.  Please request your Prim.MD to go over all Hospital Tests and Procedure/Radiological results at the follow up, please get all Hospital records sent to your Prim MD by signing hospital release before you go home.  If you experience worsening of your admission symptoms, develop shortness of breath, life threatening emergency, suicidal or homicidal thoughts you must seek medical attention immediately by calling 911 or calling your MD immediately  if symptoms less severe.  You Must read complete instructions/literature along with all the possible adverse reactions/side effects for all the Medicines you take and that have been prescribed to you. Take any new Medicines after you have completely understood and accpet all the possible adverse reactions/side effects.   Do not drive when taking Pain medications.  Do not take more than prescribed Pain, Sleep and Anxiety Medications  Wear Seat belts while driving.   Discharge wound care:   Complete by: As directed    Labial wound care as told by Dr Armond Craw activity slowly   Complete by: As directed        Discharge Medications   Allergies as of 12/19/2024  No Active Allergies      Medication List     TAKE these medications    acetaminophen  500 MG  tablet Commonly known as: TYLENOL  Take 1,000 mg by mouth every 6 (six) hours as needed for moderate pain (pain score 4-6) or mild pain (pain score 1-3).   amoxicillin -clavulanate 875-125 MG tablet Commonly known as: AUGMENTIN  Take 1 tablet by mouth every 12 (twelve) hours.   ascorbic acid 500 MG tablet Commonly known as: VITAMIN C Take 1,000 mg by mouth daily.   Blood Glucose Monitoring Suppl Devi 1 each by Does not apply route in the morning, at noon, and at bedtime. May substitute to any manufacturer covered by patient's insurance.   BLOOD GLUCOSE TEST STRIPS Strp 1 each by In Vitro route in the morning, at noon, and at bedtime. May substitute to any manufacturer covered by patient's insurance.   doxycycline  100 MG tablet Commonly known as: VIBRA -TABS Take 1 tablet (100 mg total) by mouth every 12 (twelve) hours.   ibuprofen 200 MG tablet Commonly known as: ADVIL Take 600 mg by mouth every 6 (six) hours as needed for mild pain (pain score 1-3) or moderate pain (pain score 4-6).   insulin  aspart 100 UNIT/ML FlexPen Commonly known as: NOVOLOG  Before each meal 3 times a day, 140-199 - 2 units, 200-250 - 4 units, 251-299 - 6 units,  300-349 - 8 units,  350 or above 10 units.   insulin  glargine 100 UNIT/ML Solostar Pen Commonly known as: LANTUS  Inject 20 Units into the skin 2 (two) times daily.   Insulin  Pen Needle 32G X 4 MM Misc Please provide 1 month supply   Lancet Device Misc 1 each by Does not apply route in the morning, at noon, and at bedtime. May substitute to any manufacturer covered by patient's insurance.   Lancets Misc 1 each by Does not apply route as directed. Dispense based on patient and insurance preference. Use up to four times daily as directed. (FOR ICD-10 E10.9, E11.9).   metroNIDAZOLE  500 MG tablet Commonly known as: FLAGYL  Take 1 tablet (500 mg total) by mouth 3 (three) times daily.   traMADol  50 MG tablet Commonly known as: Ultram  Take 1 tablet  (50 mg total) by mouth every 12 (twelve) hours as needed.   VITAMIN D  PO Take 1 tablet by mouth daily.               Discharge Care Instructions  (From admission, onward)           Start     Ordered   12/19/24 0000  Discharge wound care:       Comments: Labial wound care as told by Dr Armond   12/19/24 1626             Follow-up Information     Cleotilde Bernardino Hutchinson, PA. Schedule an appointment as soon as possible for a visit in 1 week(s).   Specialty: Physician Assistant Contact information: [phone] [place] [address] [phone]         Armond Cape, MD .   Specialty: Obstetrics and Gynecology Contact information: [address] [place] [address] [phone]                 Major procedures and Radiology Reports - PLEASE review detailed and final reports thoroughly  -      CT ABDOMEN PELVIS W CONTRAST Result Date: 12/15/2024 CLINICAL DATA:  Acute abdominal pain. Left labial abscess. Suspected sepsis.  EXAM: CT ABDOMEN AND PELVIS WITH CONTRAST TECHNIQUE: Multidetector CT imaging of the abdomen and pelvis was performed using the standard protocol following bolus administration of intravenous contrast. RADIATION DOSE REDUCTION: This exam was performed according to the departmental dose-optimization program which includes automated exposure control, adjustment of the mA and/or kV according to patient size and/or use of iterative reconstruction technique. CONTRAST:  75mL OMNIPAQUE  IOHEXOL  350 MG/ML SOLN COMPARISON:  None Available. FINDINGS: Lower Chest: No acute findings. Hepatobiliary: Mild diffuse hepatic steatosis is seen. 2.2 cm low-attenuation mass is seen in segment 2 of the left lobe, and 1.9 cm low-attenuation mass is seen in segment 5 of the right lobe. These both have nonspecific characteristics. Gallbladder is unremarkable. No evidence of biliary ductal dilatation. Pancreas:  No mass or inflammatory changes. Spleen:  Within normal limits in size and appearance. Adrenals/Urinary Tract: No suspicious masses identified. No evidence of ureteral calculi or hydronephrosis. Unremarkable unopacified urinary bladder. Stomach/Bowel: No evidence of obstruction, inflammatory process or abnormal fluid collections. Normal appendix visualized. Vascular/Lymphatic: Mild left external iliac and obturator lymphadenopathy is seen with largest lymph node measuring 14 mm on image 69/3. Shotty sub-cm abdominal retroperitoneal lymph nodes are noted, but are not pathologically enlarged. No acute vascular findings. Reproductive: Multiple uterine fibroids are seen, largest measuring 6 cm. IUD is seen in expected position. No evidence of adnexal mass or free fluid. Other: Soft tissue stranding is seen in the subcutaneous fat of the left mons pubis and labia, consistent with cellulitis. Left labia is incompletely visualized on this exam. Musculoskeletal:  No suspicious bone lesions identified. IMPRESSION: Soft tissue stranding in subcutaneous fat of left mons pubis and labia, consistent with cellulitis. Left labia is incompletely visualized on this exam. Mild left external iliac and obturator lymphadenopathy, likely reactive in etiology. Recommend continued follow-up by CT in 6 months. Multiple uterine fibroids, largest measuring 6 cm. IUD in expected position. Mild hepatic steatosis. Two indeterminate low-attenuation liver masses, largest measuring 2.2 cm. Nonemergent outpatient abdomen MRI without and with contrast is recommended for further characterization. Electronically Signed   By: Norleen DELENA Kil M.D.   On: 12/15/2024 12:25    Micro Results    Recent Results (from the past 240 hours)  Blood culture (routine x 2)     Status: None (Preliminary result)   Collection Time: 12/15/24  9:34 AM   Specimen: BLOOD  Result Value Ref Range Status   Specimen Description BLOOD RIGHT ANTECUBITAL  Final   Special Requests   Final    BOTTLES DRAWN AEROBIC  AND ANAEROBIC Blood Culture adequate volume   Culture   Final    NO GROWTH 4 DAYS Performed at [facility], [address] [place], [place] [address]    Report Status PENDING  Incomplete  Culture, blood (x 2)     Status: None (Preliminary result)   Collection Time: 12/15/24  8:58 PM   Specimen: BLOOD RIGHT HAND  Result Value Ref Range Status   Specimen Description BLOOD RIGHT HAND  Final   Special Requests   Final    BOTTLES DRAWN AEROBIC AND ANAEROBIC Blood Culture adequate volume   Culture   Final    NO GROWTH 4 DAYS Performed at [facility], [address] [place], [place] [address]    Report Status PENDING  Incomplete  Culture, blood (x 2)     Status: None (Preliminary result)   Collection Time: 12/15/24  9:05 PM   Specimen: BLOOD LEFT HAND  Result Value Ref  Range Status   Specimen Description BLOOD LEFT HAND  Final   Special Requests   Final    BOTTLES DRAWN AEROBIC AND ANAEROBIC Blood Culture adequate volume   Culture   Final    NO GROWTH 4 DAYS Performed at [facility], [address] [place], [place] [address]    Report Status PENDING  Incomplete    Today   Subjective    Kaitlyn Bird today has no headache,no chest abdominal pain,no new weakness tingling or numbness, feels much better wants to go home today.    Objective   Blood pressure 129/78, pulse 86, temperature 98.4 F (36.9 C), temperature source Oral, resp. rate 18, height 5' 7 (1.702 m), weight 127.5 kg, SpO2 97%.   Intake/Output Summary (Last 24 hours) at 12/19/2024 1626 Last data filed at 12/19/2024 0825 Gross per 24 hour  Intake 6 ml  Output --  Net 6 ml    Exam  Awake Alert, No new F.N deficits,    [place].AT,PERRAL Supple Neck,   Symmetrical Chest wall movement, Good air movement bilaterally, CTAB RRR,No Gallops,   +ve B.Sounds, Abd Soft, Non tender,  No Cyanosis, Clubbing or edema    Data Review   Recent Labs  Lab 12/15/24 0911 12/15/24 1519 12/16/24 1412  12/17/24 0225 12/18/24 0407  WBC 13.6* 13.4* 10.4 13.2* 10.5  HGB 14.9 12.6 12.8 12.3 11.3*  HCT 42.5 36.3 37.6 35.0* 32.8*  PLT 250 206 183 200 217  MCV 87.8 88.8 91.3 88.4 90.1  MCH 30.8 30.8 31.1 31.1 31.0  MCHC 35.1 34.7 34.0 35.1 34.5  RDW 12.6 12.9 13.2 13.2 13.2  LYMPHSABS 1.0 1.9  --  1.6 1.4  MONOABS 0.8 0.9  --  1.1* 0.9  EOSABS 0.0 0.0  --  0.0 0.1  BASOSABS 0.0 0.0  --  0.0 0.0    Recent Labs  Lab 12/15/24 0911 12/15/24 0942 12/15/24 1519 12/15/24 1527 12/16/24 1412 12/17/24 0225 12/18/24 0407 12/19/24 0403  NA 129*  --   --   --  132* 136 139 140  K 4.0  --   --   --  4.0 3.5 3.4* 3.4*  CL 97*  --   --   --  106 105 108 110  CO2 18*  --   --   --  16* 19* 20* 19*  ANIONGAP 14  --   --   --  10 12 11 11   GLUCOSE 424*  --   --   --  315* 251* 154* 114*  BUN 9  --   --   --  6 6 6 9   CREATININE 0.87  --   --   --  0.74 0.80 0.61 0.57  AST 20  --   --   --   --   --   --  28  ALT 28  --   --   --   --   --   --  22  ALKPHOS 78  --   --   --   --   --   --  71  BILITOT 0.9  --   --   --   --   --   --  0.4  ALBUMIN 4.0  --   --   --   --   --   --  2.6*  CRP  --   --   --   --   --   --  22.5*  --  LATICACIDVEN  --  1.7  --  1.1  --  1.0  --   --   INR  --   --  1.2  --   --   --   --   --   HGBA1C  --   --  12.5*  --   --   --   --   --   MG  --   --  1.6*  --  2.0 1.8 2.0  --   PHOS  --   --  2.7  --   --   --   --   --   CALCIUM 9.7  --   --   --  8.3* 8.4* 8.3* 8.1*    Total Time in preparing paper work, data evaluation and todays exam - 35 minutes  Signature  -    Lavada Stank M.D on 12/19/2024 at 4:26 PM   -  To page go to [email or web address]      "

## 2024-12-20 LAB — CULTURE, BLOOD (ROUTINE X 2)
Culture: NO GROWTH
Culture: NO GROWTH
Culture: NO GROWTH
Special Requests: ADEQUATE
Special Requests: ADEQUATE
Special Requests: ADEQUATE

## 2024-12-20 LAB — GLUTAMIC ACID DECARBOXYLASE AUTO ABS: Glutamic Acid Decarb Ab: <5 U/mL (ref 0.0–5.0)

## 2024-12-27 ENCOUNTER — Other Ambulatory Visit (HOSPITAL_COMMUNITY): Payer: Self-pay

## 2024-12-27 MED ORDER — OZEMPIC (0.25 OR 0.5 MG/DOSE) 2 MG/3ML ~~LOC~~ SOPN
PEN_INJECTOR | SUBCUTANEOUS | 0 refills | Status: AC
  Filled 2024-12-27: qty 3, 28d supply, fill #0

## 2024-12-27 MED ORDER — DIETHYLPROPION HCL ER 75 MG PO TB24
75.0000 mg | ORAL_TABLET | Freq: Every day | ORAL | 0 refills | Status: AC
  Filled 2024-12-27: qty 30, 30d supply, fill #0

## 2024-12-27 MED ORDER — ROSUVASTATIN CALCIUM 20 MG PO TABS
20.0000 mg | ORAL_TABLET | Freq: Every day | ORAL | 2 refills | Status: AC
  Filled 2024-12-27: qty 30, 30d supply, fill #0

## 2024-12-27 MED ORDER — METFORMIN HCL 500 MG PO TABS
500.0000 mg | ORAL_TABLET | Freq: Two times a day (BID) | ORAL | 0 refills | Status: AC
  Filled 2024-12-27: qty 60, 30d supply, fill #0

## 2024-12-27 MED ORDER — DEXCOM G7 15 DAY SENSOR MISC
1.0000 | 3 refills | Status: AC
  Filled 2024-12-27 (×2): qty 2, 30d supply, fill #0

## 2024-12-28 ENCOUNTER — Other Ambulatory Visit (HOSPITAL_COMMUNITY): Payer: Self-pay
